# Patient Record
Sex: Male | Born: 1955 | Race: White | Hispanic: No | Marital: Married | Smoking: Never smoker
Health system: Southern US, Community
[De-identification: ages and names within clinical notes are randomized; demographics above are authoritative.]

## PROBLEM LIST (undated history)

## (undated) DIAGNOSIS — I10 Essential (primary) hypertension: Secondary | ICD-10-CM

## (undated) DIAGNOSIS — E785 Hyperlipidemia, unspecified: Secondary | ICD-10-CM

## (undated) DIAGNOSIS — I2510 Atherosclerotic heart disease of native coronary artery without angina pectoris: Secondary | ICD-10-CM

## (undated) DIAGNOSIS — I219 Acute myocardial infarction, unspecified: Secondary | ICD-10-CM

## (undated) HISTORY — DX: Atherosclerotic heart disease of native coronary artery without angina pectoris: I25.10

## (undated) HISTORY — PX: ENDOSCOPIC STENT PLACEMENT W/ MLB: SHX1507

---
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
## 1997-03-22 DIAGNOSIS — I219 Acute myocardial infarction, unspecified: Secondary | ICD-10-CM

## 1997-03-22 HISTORY — DX: Acute myocardial infarction, unspecified: I21.9

## 1997-03-22 HISTORY — PX: CORONARY ANGIOPLASTY WITH STENT PLACEMENT: SHX49

## 1997-03-22 HISTORY — PX: CARDIAC CATHETERIZATION: SHX172

## 2012-01-19 ENCOUNTER — Ambulatory Visit (HOSPITAL_COMMUNITY): Payer: Self-pay

## 2012-01-19 ENCOUNTER — Ambulatory Visit (HOSPITAL_COMMUNITY)
Admission: RE | Admit: 2012-01-19 | Discharge: 2012-01-19 | Disposition: A | Discharge status: Home / Self Care | Payer: BC Managed Care – PPO | Source: Ambulatory Visit | Attending: Interventional Cardiology | Admitting: Interventional Cardiology

## 2012-01-19 DIAGNOSIS — I2510 Atherosclerotic heart disease of native coronary artery without angina pectoris: Secondary | ICD-10-CM | POA: Insufficient documentation

## 2012-01-19 DIAGNOSIS — I252 Old myocardial infarction: Secondary | ICD-10-CM

## 2012-01-19 NOTE — Progress Notes (Signed)
  Echocardiogram Echocardiogram Stress Test has been performed.  Timothy Hayden 01/19/2012, 12:42 PM

## 2012-10-24 ENCOUNTER — Other Ambulatory Visit (HOSPITAL_COMMUNITY): Payer: Self-pay | Admitting: Interventional Cardiology

## 2012-10-24 DIAGNOSIS — I2510 Atherosclerotic heart disease of native coronary artery without angina pectoris: Secondary | ICD-10-CM

## 2012-11-15 ENCOUNTER — Ambulatory Visit (HOSPITAL_COMMUNITY)
Admission: RE | Admit: 2012-11-15 | Discharge: 2012-11-15 | Disposition: A | Discharge status: Home / Self Care | Payer: BC Managed Care – PPO | Source: Ambulatory Visit | Attending: Interventional Cardiology | Admitting: Interventional Cardiology

## 2012-11-15 DIAGNOSIS — I252 Old myocardial infarction: Secondary | ICD-10-CM | POA: Insufficient documentation

## 2012-11-15 DIAGNOSIS — I2510 Atherosclerotic heart disease of native coronary artery without angina pectoris: Secondary | ICD-10-CM

## 2012-11-15 NOTE — Progress Notes (Signed)
  Echocardiogram Echocardiogram Stress Test has been performed.  Timothy Hayden FRANCES 11/15/2012, 10:46 AM

## 2012-12-20 ENCOUNTER — Telehealth: Payer: Self-pay | Admitting: Interventional Cardiology

## 2012-12-20 ENCOUNTER — Telehealth: Payer: Self-pay | Admitting: Cardiology

## 2012-12-20 NOTE — Telephone Encounter (Signed)
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

## 2012-12-20 NOTE — Telephone Encounter (Signed)
Lmom letting pt know I will leave tracings up front for him to pick up.

## 2013-04-04 ENCOUNTER — Telehealth: Payer: Self-pay | Admitting: *Deleted

## 2013-04-04 MED ORDER — METOPROLOL TARTRATE 50 MG PO TABS: 50.0000 mg | ORAL_TABLET | Freq: Two times a day (BID) | ORAL | Status: DC

## 2013-04-04 MED ORDER — ATORVASTATIN CALCIUM 40 MG PO TABS: 40.0000 mg | ORAL_TABLET | Freq: Every day | ORAL | Status: DC

## 2013-04-04 NOTE — Telephone Encounter (Signed)
Spoke with pts wife and verified statin, which is atorvastatin. Refilled.

## 2013-04-04 NOTE — Telephone Encounter (Signed)
Patient requests refills on simvastatin and metoprolol to be [facility] to [facility] [address] in [place]. Thanks, MI

## 2013-06-23 ENCOUNTER — Encounter: Payer: Self-pay | Admitting: *Deleted

## 2013-06-23 DIAGNOSIS — I2510 Atherosclerotic heart disease of native coronary artery without angina pectoris: Secondary | ICD-10-CM | POA: Insufficient documentation

## 2013-06-23 DIAGNOSIS — I25810 Atherosclerosis of coronary artery bypass graft(s) without angina pectoris: Secondary | ICD-10-CM

## 2013-09-17 ENCOUNTER — Encounter: Payer: Self-pay | Admitting: Interventional Cardiology

## 2013-09-27 ENCOUNTER — Other Ambulatory Visit: Payer: Self-pay | Admitting: Interventional Cardiology

## 2013-09-28 ENCOUNTER — Telehealth: Payer: Self-pay | Admitting: *Deleted

## 2013-09-28 MED ORDER — ATORVASTATIN CALCIUM 40 MG PO TABS: 40.0000 mg | ORAL_TABLET | Freq: Every day | ORAL | Status: DC

## 2013-09-28 NOTE — Telephone Encounter (Signed)
Refilled

## 2013-09-28 NOTE — Telephone Encounter (Signed)
[facility] [place] requests 90 day atorvastatin refill for patient. Thanks, MI

## 2013-10-01 ENCOUNTER — Ambulatory Visit (INDEPENDENT_AMBULATORY_CARE_PROVIDER_SITE_OTHER): Payer: BC Managed Care – PPO | Admitting: Interventional Cardiology

## 2013-10-01 ENCOUNTER — Telehealth: Payer: Self-pay | Admitting: Interventional Cardiology

## 2013-10-01 ENCOUNTER — Encounter: Payer: Self-pay | Admitting: Interventional Cardiology

## 2013-10-01 VITALS — BP 116/70 | HR 55 | Ht 69.0 in | Wt 202.2 lb

## 2013-10-01 DIAGNOSIS — E782 Mixed hyperlipidemia: Secondary | ICD-10-CM | POA: Insufficient documentation

## 2013-10-01 DIAGNOSIS — I2510 Atherosclerotic heart disease of native coronary artery without angina pectoris: Secondary | ICD-10-CM | POA: Insufficient documentation

## 2013-10-01 DIAGNOSIS — I252 Old myocardial infarction: Secondary | ICD-10-CM

## 2013-10-01 NOTE — Telephone Encounter (Signed)
Pt spoke with his insurance company and it will cost him $1300 out of pocket to have stress echocardiogram here at our office. Pt would like to know if Dr. Eldridge DaceVaranasi is okay with him having somewhere else; possibly in [place].

## 2013-10-01 NOTE — Patient Instructions (Signed)
Your physician recommends that you continue on your current medications as directed. Please refer to the Current Medication list given to you today.  Your physician has requested that you have a stress echocardiogram. For further information please visit [email or web address]. Please follow instruction sheet as given.  Your physician wants you to follow-up in:1 year with Dr. Varanasi. You will receive a reminder letter in the mail two months in advance. If you don't receive a letter, please call our office to schedule the follow-up appointment.  

## 2013-10-01 NOTE — Progress Notes (Signed)
Patient ID: Timothy Hayden, male   DOB: [birth year], 58 y.o.   MRN: [id number]    [address] [place], [place]  [address] Phone: [phone] Fax:  [phone]  Date:  10/01/2013   ID:  Timothy Hayden, DOB [birth year], MRN [id number]  PCP:  Malka SoJOBE,DANIEL B., MD      History of Present Illness: Timothy Hayden is a 58 y.o. 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 15 years ago, he was in [place] and had a cath and subsequent angioplasty at that time. A stent was placed. Six months later, he had a repeat cath and everything was fine at that time. He has had annual stress tests since then and passed all of them.  In 1999, he had pain in the middle of his chest. He has not had any further sx like that. He could also feel some palpitations as well.  He needs a stress echo for his pilot's license Exercise had decreased over the past year. He has increased recently. He walks in the treadmill and uses a bike without symptoms. No issues with side effects from the medicine he is taking. No lightheadedness, muscle pains, syncope. CAD/ASCVD:  Denies : Chest pain.  Diaphoresis.  Dizziness.  Dyspnea on exertion.  Leg edema.  Nitroglycerin.  Orthopnea.  Palpitations.  Paroxysmal nocturnal dyspnea.  Shortness of breath.  Syncope.   Last stress was in 8/14 and was negative for ischemia.  Wt Readings from Last 3 Encounters:  10/01/13 202 lb 3.2 oz (91.717 kg)     Past Medical History  Diagnosis Date  . CAD (coronary artery disease)     Current Outpatient Prescriptions  Medication Sig Dispense Refill  . Ascorbic Acid (VITAMIN C) 1000 MG tablet Take 1,000 mg by mouth daily.      Marland Kitchen. aspirin 81 MG tablet Take 81 mg by mouth daily.      Marland Kitchen. atorvastatin (LIPITOR) 40 MG tablet Take 1 tablet (40 mg total) by mouth daily.  90 tablet  0  . metoprolol (LOPRESSOR) 50 MG tablet TAKE 1 TABLET TWICE A DAY  180 tablet  0  . Omega-3 Fatty Acids (FISH OIL) 1000 MG CAPS Take by mouth.       No  current facility-administered medications for this visit.    Allergies:   No Known Allergies  Social History:  The patient  reports that he has never smoked. He does not have any smokeless tobacco history on file. He reports that he does not drink alcohol or use illicit drugs.   Family History:  The patient's family history includes CAD in his mother.   ROS:  Please see the history of present illness.  No nausea, vomiting.  No fevers, chills.  No focal weakness.  No dysuria.    All other systems reviewed and negative.   PHYSICAL EXAM: VS:  BP 116/70  Pulse 55  Ht 5\' 9"  (1.753 m)  Wt 202 lb 3.2 oz (91.717 kg)  BMI 29.85 kg/m2 Well nourished, well developed, in no acute distress HEENT: normal Neck: no JVD, no carotid bruits Cardiac:  normal S1, S2; RRR;  Lungs:  clear to auscultation bilaterally, no wheezing, rhonchi or rales Abd: soft, nontender, no hepatomegaly Ext: no edema Skin: warm and dry Neuro:   no focal abnormalities noted  EKG:  Sinus bradycardia, otherwise normal    ASSESSMENT AND PLAN:  Coronary atherosclerosis of native coronary artery  Continue Aspirin Tablet, 81 MG, 1 tablet, Orally,  Once a day Notes: No angina. Plan for stress echo for pilot's license in 1/61. No bleeding side effects from the aspirin.    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 No side effects from this medicine.    3. Hypercholesterolemia, Mixed  Continue Atorvastatin Calcium Tablet, 40 MG, 1 tablet, Orally, Once a day Notes: LDL target 70. He will have labs sent over to our office. In 12/23/11, LDL was 83, HDL 39. Fish oil was started. No myalgias. To be cecked by his new PMD and will have results sent to [place].   Follow Up  1 Year (Reason: CAD)     Signed, Fredric Mare, MD, [facility] 10/01/2013 10:19 AM

## 2013-10-01 NOTE — Telephone Encounter (Signed)
New message ° ° ° ° ° ° ° ° ° °Pt returning nurses call °

## 2013-10-02 NOTE — Telephone Encounter (Signed)
Awaiting return call from patient to let me know where he would like to have test done.

## 2013-10-02 NOTE — Telephone Encounter (Signed)
Ok with me 

## 2013-10-12 NOTE — Telephone Encounter (Signed)
lmtrc

## 2013-10-15 NOTE — Telephone Encounter (Signed)
Spoke with pt and he would like to keep appt here in our office on 10/22/13.

## 2013-10-16 ENCOUNTER — Ambulatory Visit: Payer: BC Managed Care – PPO | Admitting: Interventional Cardiology

## 2013-10-22 ENCOUNTER — Ambulatory Visit (HOSPITAL_COMMUNITY): Payer: BC Managed Care – PPO | Attending: Interventional Cardiology

## 2013-10-22 DIAGNOSIS — I2510 Atherosclerotic heart disease of native coronary artery without angina pectoris: Secondary | ICD-10-CM

## 2013-10-22 NOTE — Progress Notes (Addendum)
Stress echo performed. 

## 2013-12-12 ENCOUNTER — Telehealth: Payer: Self-pay | Admitting: Cardiology

## 2013-12-12 NOTE — Telephone Encounter (Signed)
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  Fax [phone]  Phone # [phone]

## 2013-12-13 ENCOUNTER — Encounter: Payer: Self-pay | Admitting: Cardiology

## 2013-12-13 NOTE — Telephone Encounter (Signed)
Letter created, signed and faxed to FAA.

## 2013-12-19 ENCOUNTER — Other Ambulatory Visit: Payer: Self-pay

## 2013-12-19 MED ORDER — ATORVASTATIN CALCIUM 40 MG PO TABS: 40.0000 mg | ORAL_TABLET | Freq: Every day | ORAL | Status: DC

## 2013-12-19 MED ORDER — METOPROLOL TARTRATE 50 MG PO TABS: ORAL_TABLET | ORAL | Status: DC

## 2014-06-10 ENCOUNTER — Other Ambulatory Visit: Payer: Self-pay | Admitting: Interventional Cardiology

## 2014-12-02 ENCOUNTER — Encounter: Payer: Self-pay | Admitting: Interventional Cardiology

## 2014-12-02 ENCOUNTER — Ambulatory Visit (INDEPENDENT_AMBULATORY_CARE_PROVIDER_SITE_OTHER): Payer: BLUE CROSS/BLUE SHIELD | Admitting: Interventional Cardiology

## 2014-12-02 VITALS — BP 106/74 | HR 79 | Ht 69.0 in | Wt 196.1 lb

## 2014-12-02 DIAGNOSIS — I252 Old myocardial infarction: Secondary | ICD-10-CM | POA: Diagnosis not present

## 2014-12-02 DIAGNOSIS — E782 Mixed hyperlipidemia: Secondary | ICD-10-CM

## 2014-12-02 DIAGNOSIS — I2510 Atherosclerotic heart disease of native coronary artery without angina pectoris: Secondary | ICD-10-CM | POA: Diagnosis not present

## 2014-12-02 NOTE — Patient Instructions (Signed)
Medication Instructions:  Your physician recommends that you continue on your current medications as directed. Please refer to the Current Medication list given to you today.   Labwork: none  Testing/Procedures: Your physician has requested that you have a stress echocardiogram. For further information please visit [email or web address]. Please follow instruction sheet as given.    Follow-Up: Your physician wants you to follow-up in: 12 months.  You will receive a reminder letter in the mail two months in advance. If you don't receive a letter, please call our office to schedule the follow-up appointment.   Any Other Special Instructions Will Be Listed Below (If Applicable).   

## 2014-12-02 NOTE — Progress Notes (Signed)
Patient ID: Timothy Hayden, male   DOB: 06-01-1955, 59 y.o.   MRN: [id number]     Cardiology Office Note   Date:  12/02/2014   ID:  Timothy Hayden, DOB Jul 04, 1955, MRN [id number]  PCP:  Malka So., MD    No chief complaint on file. 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 In 1999, he was in [place], [place] and had a cath and subsequent angioplasty at that time. A stent was placed. Six months later, he had a repeat cath and everything was fine at that time. He has had annual stress tests since then and passed all of them.  In 1999, he had pain in the middle of his chest. He has not had any further sx like that. He could also feel some palpitations as well.   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 Exercise had decreased over the past year. He walks in the treadmill and uses a bike without symptoms. No issues with side effects from the medicine he is taking. No lightheadedness, muscle pains, syncope. CAD/ASCVD:  Denies : Chest pain.  Diaphoresis.  Dizziness.  Dyspnea on exertion.  Leg edema.  Nitroglycerin.  Orthopnea.  Palpitations.  Paroxysmal nocturnal dyspnea.  Shortness of breath.  Syncope.   Last stress was in 8/15 and was negative for ischemia.  Overall, he is feeling well.  He needs another stress test.    Past Medical History  Diagnosis Date  . CAD (coronary artery disease)     Past Surgical History  Procedure Laterality Date  . Endoscopic stent placement w/ mlb       Current Outpatient Prescriptions  Medication Sig Dispense Refill  . aspirin 81 MG tablet Take 81 mg by mouth daily.    Marland Kitchen atorvastatin (LIPITOR) 40 MG tablet TAKE 1 TABLET (40 MG TOTAL) BY MOUTH DAILY. 90 tablet 1  . metoprolol (LOPRESSOR) 50 MG tablet Take 50 mg by mouth 2 (two) times daily.    .  Multiple Vitamin (MULTI-VITAMINS) TABS Take by mouth daily.    . Omega-3 Fatty Acids (FISH OIL) 1000 MG CAPS Take 1,000 mg by mouth daily.      No current facility-administered medications for this visit.    Allergies:   Review of patient's allergies indicates no known allergies.    Social History:  The patient  reports that he has never smoked. He does not have any smokeless tobacco history on file. He reports that he does not drink alcohol or use illicit drugs.   Family History:  The patient's family history includes CAD in his mother.    ROS:  Please see the history of present illness.   Otherwise, review of systems are positive for knee pain.   All other systems are reviewed and negative.    PHYSICAL EXAM: VS:  BP 106/74 mmHg  Pulse 79  Ht 5\' 9"  (1.753 m)  Wt 196 lb 1.9 oz (88.959 kg)  BMI 28.95 kg/m2 , BMI Body mass index is 28.95 kg/(m^2). GEN: Well nourished, well developed, in no acute distress HEENT: normal Neck: no JVD, carotid bruits, or masses Cardiac: RRR; no murmurs, rubs, or gallops,no edema  Respiratory:  clear to auscultation bilaterally, normal work of breathing GI: soft, nontender, nondistended, +  BS, small umbilical hernia MS: no deformity or atrophy Skin: warm and dry, no rash Neuro:  Strength and sensation are intact Psych: euthymic mood, full affect   EKG:   The ekg ordered today demonstrates Normal ECG   Recent Labs: No results found for requested labs within last 365 days.   Lipid Panel No results found for: CHOL, TRIG, HDL, CHOLHDL, VLDL, LDLCALC, LDLDIRECT   Other studies Reviewed: Additional studies/ records that were reviewed today with results demonstrating: Stress in 8/15 without ischemia.   ASSESSMENT AND PLAN:  1. CAD: No angina. Plan for stress echo for pilot's license in 9/52. No bleeding side effects from the aspirin.  Continue aspirin.   2. Old MI:  COntinue metoprolol.   No signs of congestive heart failure. 3. Hyperlipidemia:   Followed by Dr. Derrell Lolling, his PMD.  Continue statin. We'll try to obtain results from his PMDs office.   Current medicines are reviewed at length with the patient today.  The patient concerns regarding his medicines were addressed.  The following changes have been made:  No change  Labs/ tests ordered today include:   Orders Placed This Encounter  Procedures  . EKG 12-Lead  . Echo stress    Recommend 150 minutes/week of aerobic exercise Low fat, low carb, high fiber diet recommended  Disposition:   FU in 1 year   Delorise Jackson., MD  12/02/2014 9:54 AM    [facility] [address] [place], [place], [place]  [address] Phone: [phone]; Fax: [phone]

## 2014-12-07 ENCOUNTER — Other Ambulatory Visit: Payer: Self-pay | Admitting: Interventional Cardiology

## 2014-12-19 ENCOUNTER — Ambulatory Visit (HOSPITAL_COMMUNITY): Payer: BLUE CROSS/BLUE SHIELD | Attending: Interventional Cardiology

## 2014-12-19 DIAGNOSIS — I252 Old myocardial infarction: Secondary | ICD-10-CM | POA: Insufficient documentation

## 2014-12-19 DIAGNOSIS — I2510 Atherosclerotic heart disease of native coronary artery without angina pectoris: Secondary | ICD-10-CM

## 2014-12-23 ENCOUNTER — Encounter: Payer: Self-pay | Admitting: Interventional Cardiology

## 2014-12-24 ENCOUNTER — Telehealth: Payer: Self-pay | Admitting: Interventional Cardiology

## 2014-12-24 DIAGNOSIS — Z01810 Encounter for preprocedural cardiovascular examination: Secondary | ICD-10-CM

## 2014-12-24 DIAGNOSIS — E782 Mixed hyperlipidemia: Secondary | ICD-10-CM

## 2014-12-24 DIAGNOSIS — I2510 Atherosclerotic heart disease of native coronary artery without angina pectoris: Secondary | ICD-10-CM

## 2014-12-24 DIAGNOSIS — Z7901 Long term (current) use of anticoagulants: Secondary | ICD-10-CM

## 2014-12-24 NOTE — Telephone Encounter (Signed)
The pt is requesting a call from Dr Eldridge Dace to discuss his Echo results and the need for him to have a Cath. He states that he will be available to talk all day tomorrow at [phone]. Also, he states that he will be available at this number on and off after tomorrow.  He is advised that I am forwarding this message to Dr Eldridge Dace.

## 2014-12-24 NOTE — Telephone Encounter (Signed)
NewMessage  Pt calling to speak w/ RN concerning yesterdays conversation about echo stress. Please call back and discuss.

## 2014-12-25 NOTE — Telephone Encounter (Signed)
Patient is agreeable to cath.  He would like to try Wednesday October 19.  I am listed as off but I can do the case if you can get me scheduled for that one case only in the lab that day. He is expecting a call with instructions.  He can get blood work on that Monday before

## 2014-12-30 ENCOUNTER — Telehealth: Payer: Self-pay

## 2014-12-30 DIAGNOSIS — Z7901 Long term (current) use of anticoagulants: Secondary | ICD-10-CM

## 2014-12-30 DIAGNOSIS — R931 Abnormal findings on diagnostic imaging of heart and coronary circulation: Secondary | ICD-10-CM

## 2014-12-30 DIAGNOSIS — I2510 Atherosclerotic heart disease of native coronary artery without angina pectoris: Secondary | ICD-10-CM

## 2014-12-30 DIAGNOSIS — Z01812 Encounter for preprocedural laboratory examination: Secondary | ICD-10-CM

## 2014-12-30 NOTE — Telephone Encounter (Signed)
**Note De-Identified  Obfuscation** LMTCB. Per Dr Eldridge Dace the pt needs to have cath on 10/19. Cath has been scheduled on 10/19 at 8:30. Will need to go over Cath instructions with the pt (letter is under letters tab in the pts chart) and he will need to have BMET, CBCD and INR drawn on 10/17 (labs have been ordered and a appt scheduled to be drawn on 10/17).

## 2015-01-03 NOTE — Telephone Encounter (Signed)
**Note De-Identified  Obfuscation** The pt is aware and is coming to the office on 10/17 for lab work and he is aware that I will go over his cath instructions with him at That time.

## 2015-01-03 NOTE — Telephone Encounter (Signed)
**Note De-Identified  Obfuscation** The pt is coming to the office for his pre-procedure lab work on Monday 10/17 and he will see me to go over his cath instructions. The pt is aware.

## 2015-01-06 ENCOUNTER — Other Ambulatory Visit (INDEPENDENT_AMBULATORY_CARE_PROVIDER_SITE_OTHER): Payer: BLUE CROSS/BLUE SHIELD | Admitting: *Deleted

## 2015-01-06 DIAGNOSIS — Z7901 Long term (current) use of anticoagulants: Secondary | ICD-10-CM

## 2015-01-06 DIAGNOSIS — Z01810 Encounter for preprocedural cardiovascular examination: Secondary | ICD-10-CM

## 2015-01-06 DIAGNOSIS — E782 Mixed hyperlipidemia: Secondary | ICD-10-CM

## 2015-01-06 DIAGNOSIS — I2510 Atherosclerotic heart disease of native coronary artery without angina pectoris: Secondary | ICD-10-CM | POA: Diagnosis not present

## 2015-01-06 LAB — COMPREHENSIVE METABOLIC PANEL
ALT: 27 U/L (ref 9–46)
AST: 23 U/L (ref 10–35)
Albumin: 4.2 g/dL (ref 3.6–5.1)
Alkaline Phosphatase: 78 U/L (ref 40–115)
BUN: 17 mg/dL (ref 7–25)
CO2: 28 mmol/L (ref 20–31)
Calcium: 9.4 mg/dL (ref 8.6–10.3)
Chloride: 103 mmol/L (ref 98–110)
Creat: 1.01 mg/dL (ref 0.70–1.25)
Glucose, Bld: 94 mg/dL (ref 65–99)
Potassium: 4.1 mmol/L (ref 3.5–5.3)
Sodium: 138 mmol/L (ref 135–146)
Total Bilirubin: 0.5 mg/dL (ref 0.2–1.2)
Total Protein: 7.4 g/dL (ref 6.1–8.1)

## 2015-01-06 LAB — LIPID PANEL
Cholesterol: 160 mg/dL (ref 125–200)
HDL: 38 mg/dL — ABNORMAL LOW (ref >=40)
LDL Cholesterol: 89 mg/dL (ref <130)
Total CHOL/HDL Ratio: 4.2 Ratio (ref <=5.0)
Triglycerides: 166 mg/dL — ABNORMAL HIGH (ref <150)
VLDL: 33 mg/dL — ABNORMAL HIGH (ref <30)

## 2015-01-06 LAB — PROTIME-INR

## 2015-01-06 NOTE — Telephone Encounter (Signed)
The pt arrives in the office for his pre cath lab work and he and I went over cath instructions.  I answered all of his questions.

## 2015-01-07 ENCOUNTER — Other Ambulatory Visit: Payer: Self-pay | Admitting: Interventional Cardiology

## 2015-01-07 DIAGNOSIS — R9439 Abnormal result of other cardiovascular function study: Secondary | ICD-10-CM

## 2015-01-07 LAB — CBC WITH DIFFERENTIAL/PLATELET
Basophils Absolute: 0.1 10*3/uL (ref 0.0–0.1)
Basophils Relative: 1 % (ref 0–1)
Eosinophils Absolute: 0.4 10*3/uL (ref 0.0–0.7)
Eosinophils Relative: 7 % — ABNORMAL HIGH (ref 0–5)
HCT: 42.1 % (ref 39.0–52.0)
Hemoglobin: 15 g/dL (ref 13.0–17.0)
Lymphocytes Relative: 26 % (ref 12–46)
Lymphs Abs: 1.4 10*3/uL (ref 0.7–4.0)
MCH: 30.5 pg (ref 26.0–34.0)
MCHC: 35.6 g/dL (ref 30.0–36.0)
MCV: 85.6 fL (ref 78.0–100.0)
MPV: 9.1 fL (ref 8.6–12.4)
Monocytes Absolute: 0.6 10*3/uL (ref 0.1–1.0)
Monocytes Relative: 11 % (ref 3–12)
Neutro Abs: 3 10*3/uL (ref 1.7–7.7)
Neutrophils Relative %: 55 % (ref 43–77)
Platelets: 194 10*3/uL (ref 150–400)
RBC: 4.92 MIL/uL (ref 4.22–5.81)
RDW: 13.8 % (ref 11.5–15.5)
WBC: 5.5 10*3/uL (ref 4.0–10.5)

## 2015-01-07 LAB — PROTIME-INR
INR: 1.06 (ref <1.50)
Prothrombin Time: 13.9 seconds (ref 11.6–15.2)

## 2015-01-07 NOTE — Addendum Note (Signed)
Addended by: Channing MuttersGRACE, Shaka Cardin J on: 01/07/2015 08:10 AM   Modules accepted: Orders

## 2015-01-08 ENCOUNTER — Ambulatory Visit (HOSPITAL_COMMUNITY)
Admission: RE | Admit: 2015-01-08 | Discharge: 2015-01-09 | Disposition: A | Discharge status: Home / Self Care | Payer: BLUE CROSS/BLUE SHIELD | Source: Ambulatory Visit | Attending: Interventional Cardiology | Admitting: Interventional Cardiology

## 2015-01-08 ENCOUNTER — Encounter (HOSPITAL_COMMUNITY): Payer: Self-pay | Admitting: Interventional Cardiology

## 2015-01-08 ENCOUNTER — Encounter (HOSPITAL_COMMUNITY)
Admission: RE | Disposition: A | Discharge status: Home / Self Care | Payer: Self-pay | Source: Ambulatory Visit | Attending: Interventional Cardiology

## 2015-01-08 DIAGNOSIS — I2510 Atherosclerotic heart disease of native coronary artery without angina pectoris: Secondary | ICD-10-CM | POA: Insufficient documentation

## 2015-01-08 DIAGNOSIS — E782 Mixed hyperlipidemia: Secondary | ICD-10-CM | POA: Insufficient documentation

## 2015-01-08 DIAGNOSIS — Z8249 Family history of ischemic heart disease and other diseases of the circulatory system: Secondary | ICD-10-CM | POA: Diagnosis not present

## 2015-01-08 DIAGNOSIS — Z79899 Other long term (current) drug therapy: Secondary | ICD-10-CM | POA: Diagnosis not present

## 2015-01-08 DIAGNOSIS — Y838 Other surgical procedures as the cause of abnormal reaction of the patient, or of later complication, without mention of misadventure at the time of the procedure: Secondary | ICD-10-CM | POA: Insufficient documentation

## 2015-01-08 DIAGNOSIS — T82855A Stenosis of coronary artery stent, initial encounter: Secondary | ICD-10-CM | POA: Diagnosis not present

## 2015-01-08 DIAGNOSIS — Z955 Presence of coronary angioplasty implant and graft: Secondary | ICD-10-CM

## 2015-01-08 DIAGNOSIS — Z7982 Long term (current) use of aspirin: Secondary | ICD-10-CM | POA: Diagnosis not present

## 2015-01-08 DIAGNOSIS — R9439 Abnormal result of other cardiovascular function study: Secondary | ICD-10-CM | POA: Diagnosis present

## 2015-01-08 HISTORY — DX: Acute myocardial infarction, unspecified: I21.9

## 2015-01-08 HISTORY — PX: CARDIAC CATHETERIZATION: SHX172

## 2015-01-08 HISTORY — DX: Hyperlipidemia, unspecified: E78.5

## 2015-01-08 LAB — POCT ACTIVATED CLOTTING TIME: Activated Clotting Time: 460 seconds

## 2015-01-08 SURGERY — LEFT HEART CATH AND CORONARY ANGIOGRAPHY

## 2015-01-08 MED ORDER — ACETAMINOPHEN 325 MG PO TABS: 650.0000 mg | ORAL_TABLET | ORAL | Status: DC | PRN

## 2015-01-08 MED ORDER — SODIUM CHLORIDE 0.9 % IJ SOLN: 3.0000 mL | Freq: Two times a day (BID) | INTRAMUSCULAR | Status: DC

## 2015-01-08 MED ORDER — ONDANSETRON HCL 4 MG/2ML IJ SOLN: 4.0000 mg | Freq: Four times a day (QID) | INTRAMUSCULAR | Status: DC | PRN

## 2015-01-08 MED ORDER — TIROFIBAN HCL IV 12.5 MG/250 ML
INTRAVENOUS | Status: DC | PRN
  Administered 2015-01-08: .15 ug/kg/min via INTRAVENOUS

## 2015-01-08 MED ORDER — HEPARIN SODIUM (PORCINE) 1000 UNIT/ML IJ SOLN
INTRAMUSCULAR | Status: DC | PRN
  Administered 2015-01-08: 6000 [IU] via INTRAVENOUS
  Administered 2015-01-08: 4500 [IU] via INTRAVENOUS

## 2015-01-08 MED ORDER — CLOPIDOGREL BISULFATE 300 MG PO TABS
ORAL_TABLET | ORAL | Status: DC | PRN
  Administered 2015-01-08: 600 mg via ORAL

## 2015-01-08 MED ORDER — SODIUM CHLORIDE 0.9 % IV SOLN: 250.0000 mL | INTRAVENOUS | Status: DC | PRN

## 2015-01-08 MED ORDER — ASPIRIN 81 MG PO TABS: 81.0000 mg | ORAL_TABLET | Freq: Every day | ORAL | Status: DC

## 2015-01-08 MED ORDER — IOHEXOL 350 MG/ML SOLN
INTRAVENOUS | Status: DC | PRN
  Administered 2015-01-08: 160 mL via INTRA_ARTERIAL

## 2015-01-08 MED ORDER — ASPIRIN 81 MG PO CHEW
81.0000 mg | CHEWABLE_TABLET | ORAL | Status: AC
  Administered 2015-01-08: 81 mg via ORAL

## 2015-01-08 MED ORDER — MIDAZOLAM HCL 2 MG/2ML IJ SOLN
INTRAMUSCULAR | Status: DC | PRN
  Administered 2015-01-08: 1 mg via INTRAVENOUS
  Administered 2015-01-08: 2 mg via INTRAVENOUS

## 2015-01-08 MED ORDER — VERAPAMIL HCL 2.5 MG/ML IV SOLN
INTRAVENOUS | Status: AC
  Filled 2015-01-08: qty 2

## 2015-01-08 MED ORDER — SODIUM CHLORIDE 0.9 % IJ SOLN: 3.0000 mL | INTRAMUSCULAR | Status: DC | PRN

## 2015-01-08 MED ORDER — SODIUM CHLORIDE 0.9 % WEIGHT BASED INFUSION: 1.0000 mL/kg/h | INTRAVENOUS | Status: DC

## 2015-01-08 MED ORDER — SODIUM CHLORIDE 0.9 % IV SOLN
250.0000 mL | INTRAVENOUS | Status: DC | PRN
  Administered 2015-01-08: 200 mL via INTRAVENOUS

## 2015-01-08 MED ORDER — NITROGLYCERIN 1 MG/10 ML FOR IR/CATH LAB
INTRA_ARTERIAL | Status: DC | PRN
  Administered 2015-01-08: 200 ug via INTRACORONARY
  Administered 2015-01-08: 200 ug via INTRA_ARTERIAL
  Administered 2015-01-08: 200 ug via INTRACORONARY

## 2015-01-08 MED ORDER — MIDAZOLAM HCL 2 MG/2ML IJ SOLN
INTRAMUSCULAR | Status: AC
  Filled 2015-01-08: qty 4

## 2015-01-08 MED ORDER — METOPROLOL TARTRATE 50 MG PO TABS
50.0000 mg | ORAL_TABLET | Freq: Two times a day (BID) | ORAL | Status: DC
  Administered 2015-01-08 – 2015-01-09 (×2): 50 mg via ORAL
  Filled 2015-01-08 (×2): qty 1
  Filled 2015-01-08: qty 2
  Filled 2015-01-08: qty 1
  Filled 2015-01-08: qty 2
  Filled 2015-01-08: qty 1

## 2015-01-08 MED ORDER — ANGIOPLASTY BOOK
Freq: Once | Status: AC
  Administered 2015-01-08: 22:00:00
  Filled 2015-01-08: qty 1

## 2015-01-08 MED ORDER — NITROGLYCERIN 1 MG/10 ML FOR IR/CATH LAB
INTRA_ARTERIAL | Status: AC
  Filled 2015-01-08: qty 10

## 2015-01-08 MED ORDER — TIROFIBAN (AGGRASTAT) BOLUS VIA INFUSION
INTRAVENOUS | Status: DC | PRN
  Administered 2015-01-08: 2200 ug via INTRAVENOUS

## 2015-01-08 MED ORDER — ATORVASTATIN CALCIUM 40 MG PO TABS
40.0000 mg | ORAL_TABLET | Freq: Every day | ORAL | Status: DC
  Administered 2015-01-08: 19:00:00 40 mg via ORAL
  Filled 2015-01-08: qty 1

## 2015-01-08 MED ORDER — HEPARIN (PORCINE) IN NACL 2-0.9 UNIT/ML-% IJ SOLN
INTRAMUSCULAR | Status: DC | PRN
  Administered 2015-01-08: 09:00:00 via INTRA_ARTERIAL

## 2015-01-08 MED ORDER — HEPARIN (PORCINE) IN NACL 2-0.9 UNIT/ML-% IJ SOLN
INTRAMUSCULAR | Status: DC | PRN
  Administered 2015-01-08: 10:00:00

## 2015-01-08 MED ORDER — LIDOCAINE HCL (PF) 1 % IJ SOLN
INTRAMUSCULAR | Status: AC
  Filled 2015-01-08: qty 30

## 2015-01-08 MED ORDER — ASPIRIN 81 MG PO CHEW
CHEWABLE_TABLET | ORAL | Status: AC
  Filled 2015-01-08: qty 1

## 2015-01-08 MED ORDER — TIROFIBAN HCL IV 5 MG/100ML
INTRAVENOUS | Status: AC
  Filled 2015-01-08: qty 100

## 2015-01-08 MED ORDER — SODIUM CHLORIDE 0.9 % WEIGHT BASED INFUSION
3.0000 mL/kg/h | INTRAVENOUS | Status: AC
  Administered 2015-01-08: 13:00:00 3 mL/kg/h via INTRAVENOUS

## 2015-01-08 MED ORDER — SODIUM CHLORIDE 0.9 % WEIGHT BASED INFUSION
3.0000 mL/kg/h | INTRAVENOUS | Status: DC
  Administered 2015-01-08: 3 mL/kg/h via INTRAVENOUS

## 2015-01-08 MED ORDER — ASPIRIN 81 MG PO CHEW
81.0000 mg | CHEWABLE_TABLET | Freq: Every day | ORAL | Status: DC
  Administered 2015-01-09: 10:00:00 81 mg via ORAL
  Filled 2015-01-08: qty 1

## 2015-01-08 MED ORDER — CLOPIDOGREL BISULFATE 75 MG PO TABS
75.0000 mg | ORAL_TABLET | Freq: Every day | ORAL | Status: DC
  Administered 2015-01-09: 75 mg via ORAL
  Filled 2015-01-08: qty 1

## 2015-01-08 MED ORDER — FENTANYL CITRATE (PF) 100 MCG/2ML IJ SOLN
INTRAMUSCULAR | Status: DC | PRN
  Administered 2015-01-08 (×2): 25 ug via INTRAVENOUS

## 2015-01-08 MED ORDER — CLOPIDOGREL BISULFATE 300 MG PO TABS
ORAL_TABLET | ORAL | Status: AC
  Filled 2015-01-08: qty 2

## 2015-01-08 MED ORDER — FENTANYL CITRATE (PF) 100 MCG/2ML IJ SOLN
INTRAMUSCULAR | Status: AC
  Filled 2015-01-08: qty 4

## 2015-01-08 MED ORDER — HEPARIN (PORCINE) IN NACL 2-0.9 UNIT/ML-% IJ SOLN
INTRAMUSCULAR | Status: AC
  Filled 2015-01-08: qty 1000

## 2015-01-08 SURGICAL SUPPLY — 20 items
BALLN EUPHORA RX 2.0X20 (BALLOONS) ×3
BALLN ~~LOC~~ EUPHORA RX 2.5X20 (BALLOONS) ×3
BALLOON EUPHORA RX 2.0X20 (BALLOONS) ×1 IMPLANT
BALLOON ~~LOC~~ EUPHORA RX 2.5X20 (BALLOONS) ×1 IMPLANT
CATH INFINITI 5 FR JL3.5 (CATHETERS) ×3 IMPLANT
CATH INFINITI 5FR ANG PIGTAIL (CATHETERS) ×3 IMPLANT
CATH INFINITI JR4 5F (CATHETERS) ×3 IMPLANT
DEVICE RAD COMP TR BAND LRG (VASCULAR PRODUCTS) ×3 IMPLANT
GLIDESHEATH SLEND SS 6F .021 (SHEATH) ×3 IMPLANT
GUIDE CATH RUNWAY 6FR CLS3 (CATHETERS) ×3 IMPLANT
KIT ENCORE 26 ADVANTAGE (KITS) ×3 IMPLANT
KIT HEART LEFT (KITS) ×3 IMPLANT
PACK CARDIAC CATHETERIZATION (CUSTOM PROCEDURE TRAY) ×3 IMPLANT
STENT SYNERGY DES 2.25X32 (Permanent Stent) ×3 IMPLANT
SYR MEDRAD MARK V 150ML (SYRINGE) ×3 IMPLANT
TRANSDUCER W/STOPCOCK (MISCELLANEOUS) ×3 IMPLANT
TUBING CIL FLEX 10 FLL-RA (TUBING) ×3 IMPLANT
VALVE GUARDIAN II ~~LOC~~ HEMO (MISCELLANEOUS) ×3 IMPLANT
WIRE ASAHI PROWATER 180CM (WIRE) ×3 IMPLANT
WIRE SAFE-T 1.5MM-J .035X260CM (WIRE) ×3 IMPLANT

## 2015-01-08 NOTE — Interval H&P Note (Signed)
Cath Lab Visit (complete for each Cath Lab visit)  Clinical Evaluation Leading to the Procedure:   ACS: No.  Non-ACS:    Anginal Classification: CCS II  Anti-ischemic medical therapy: Minimal Therapy (1 class of medications)  Non-Invasive Test Results: High-risk stress test findings: cardiac mortality >3%/year  Prior CABG: No previous CABG      History and Physical Interval Note:  01/08/2015 8:48 AM  Timothy Hayden  has presented today for surgery, with the diagnosis of abnormal ekg/echo  The various methods of treatment have been discussed with the patient and family. After consideration of risks, benefits and other options for treatment, the patient has consented to  Procedure(Hayden): Left Heart Cath and Coronary Angiography (N/A) as a surgical intervention .  The patient'Hayden history has been reviewed, patient examined, no change in status, stable for surgery.  I have reviewed the patient'Hayden chart and labs.  Questions were answered to the patient'Hayden satisfaction.     Timothy Hayden.

## 2015-01-08 NOTE — Progress Notes (Signed)
TR BAND REMOVAL  LOCATION:    right radial  DEFLATED PER PROTOCOL:    Yes.    TIME BAND OFF / DRESSING APPLIED:    1345   SITE UPON ARRIVAL:    Level 0  SITE AFTER BAND REMOVAL:    Level 0  REVERSE ALLEN'S TEST:     positive  CIRCULATION SENSATION AND MOVEMENT:    Within Normal Limits   Yes.    COMMENTS:   Tolerated procedure well

## 2015-01-08 NOTE — H&P (Signed)
Timothy Hayden is an 59 y.o. male.   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 In 1999, he was in [place], [place] and had a cath and subsequent angioplasty at that time. A stent was placed. Six months later, he had a repeat cath and everything was fine at that time. He has had annual stress tests since then and passed all of them.  In 1999, he had pain in the middle of his chest. He has not had any further sx like that. He could also feel some palpitations as well.   He had a resting inferior WMA and then there was ischemia with stress.  THere were 3 mm ST segment depressions which were new compared to last year.  Past Medical History  Diagnosis Date  . CAD (coronary artery disease)     Past Surgical History  Procedure Laterality Date  . Endoscopic stent placement w/ mlb      Family History  Problem Relation Age of Onset  . CAD Mother    Social History:  reports that he has never smoked. He does not have any smokeless tobacco history on file. He reports that he does not drink alcohol or use illicit drugs.  Allergies: No Known Allergies  Medications Prior to Admission  Medication Sig Dispense Refill  . aspirin 81 MG tablet Take 81 mg by mouth daily.    Marland Kitchen atorvastatin (LIPITOR) 40 MG tablet TAKE 1 TABLET (40 MG TOTAL) BY MOUTH DAILY. 90 tablet 3  . Ibuprofen-Famotidine (DUEXIS) 800-26.6 MG TABS Take 1 tablet by mouth daily as needed (pain).    . metoprolol (LOPRESSOR) 50 MG tablet TAKE 1 TABLET TWICE A DAY 180 tablet 3  . Multiple Vitamin (MULTI-VITAMINS) TABS Take 1 tablet by mouth daily.     . Omega-3 Fatty Acids (FISH OIL) 1000 MG CAPS Take 1,000 mg by mouth daily.       Results for orders placed or performed in visit on 01/06/15 (from the past 48 hour(s))  CBC with Differential     Status: Abnormal   Collection Time: 01/07/15  8:11 AM  Result Value Ref Range   WBC 5.5 4.0  - 10.5 K/uL   RBC 4.92 4.22 - 5.81 MIL/uL   Hemoglobin 15.0 13.0 - 17.0 g/dL   HCT 42.1 39.0 - 52.0 %   MCV 85.6 78.0 - 100.0 fL   MCH 30.5 26.0 - 34.0 pg   MCHC 35.6 30.0 - 36.0 g/dL   RDW 13.8 11.5 - 15.5 %   Platelets 194 150 - 400 K/uL   MPV 9.1 8.6 - 12.4 fL   Neutrophils Relative % 55 43 - 77 %   Neutro Abs 3.0 1.7 - 7.7 K/uL   Lymphocytes Relative 26 12 - 46 %   Lymphs Abs 1.4 0.7 - 4.0 K/uL   Monocytes Relative 11 3 - 12 %   Monocytes Absolute 0.6 0.1 - 1.0 K/uL   Eosinophils Relative 7 (H) 0 - 5 %   Eosinophils Absolute 0.4 0.0 - 0.7 K/uL   Basophils Relative 1 0 - 1 %   Basophils Absolute 0.1 0.0 - 0.1 K/uL   Smear Review Criteria for review not met   INR/PT     Status: None   Collection Time: 01/07/15  8:11 AM  Result Value Ref Range   Prothrombin Time 13.9 11.6 - 15.2 seconds   INR 1.06 <1.50  Comment: The INR is of principal utility in following patients on stable doses of oral anticoagulants.  The therapeutic range is generally 2.0 to 3.0, but may be 3.0 to 4.0 in patients with mechanical cardiac valves, recurrent embolisms and antiphospholipid antibodies (including lupus inhibitors).    No results found.  ROS: No bleeding issues, as above.  OBJECTIVE:   Vitals:   Filed Vitals:   01/08/15 0642  BP: 138/75  Pulse: 64  Temp: 98 F (36.7 C)  TempSrc: Oral  Resp: 18  Height: _0  (1.753 m)  Weight: 194 lb (87.998 kg)  SpO2: 100%   I&O's:  No intake or output data in the 24 hours ending 01/08/15 0813      PHYSICAL EXAM General: Well developed, well nourished, in no acute distress Head:   Normal cephalic and atramatic  Lungs:   Clear bilaterally to auscultation. Heart:   HRRR S1 S2  No JVD.   Abdomen: abdomen soft and non-tender Msk:  Back normal,  Normal strength and tone for age. Extremities:   No edema.   Neuro: Alert and oriented. Psych:  Normal affect, responds appropriately  LABS: Basic Metabolic Panel:  Recent Labs   01/06/15 0750  NA 138  K 4.1  CL 103  CO2 28  GLUCOSE 94  BUN 17  CREATININE 1.01  CALCIUM 9.4   Liver Function Tests:  Recent Labs  01/06/15 0750  AST 23  ALT 27  ALKPHOS 78  BILITOT 0.5  PROT 7.4  ALBUMIN 4.2   No results for input(s): LIPASE, AMYLASE in the last 72 hours. CBC:  Recent Labs  01/07/15 0811  WBC 5.5  NEUTROABS 3.0  HGB 15.0  HCT 42.1  MCV 85.6  PLT 194   Cardiac Enzymes: No results for input(s): CKTOTAL, CKMB, CKMBINDEX, TROPONINI in the last 72 hours. BNP: Invalid input(s): POCBNP D-Dimer: No results for input(s): DDIMER in the last 72 hours. Hemoglobin A1C: No results for input(s): HGBA1C in the last 72 hours. Fasting Lipid Panel:  Recent Labs  01/06/15 0750  CHOL 160  HDL 38*  LDLCALC 89  TRIG 166*  CHOLHDL 4.2   Thyroid Function Tests: No results for input(s): TSH, T4TOTAL, T3FREE, THYROIDAB in the last 72 hours.  Invalid input(s): FREET3 Anemia Panel: No results for input(s): VITAMINB12, FOLATE, FERRITIN, TIBC, IRON, RETICCTPCT in the last 72 hours. Coag Panel:   Lab Results  Component Value Date   INR 1.06 01/07/2015       Assessment/Plan 1) Abnormal stress test:  Plan for cath.  Several high risk features including change in LV function and marked ST depression.  Procedure explained to the patient at length and he is willing to proceed.  All questions answered.  VARANASI,JAYADEEP S. 01/08/2015, 8:13 AM

## 2015-01-09 ENCOUNTER — Encounter (HOSPITAL_COMMUNITY): Payer: Self-pay | Admitting: Physician Assistant

## 2015-01-09 DIAGNOSIS — T82855A Stenosis of coronary artery stent, initial encounter: Secondary | ICD-10-CM | POA: Diagnosis not present

## 2015-01-09 DIAGNOSIS — Z8249 Family history of ischemic heart disease and other diseases of the circulatory system: Secondary | ICD-10-CM | POA: Diagnosis not present

## 2015-01-09 DIAGNOSIS — E782 Mixed hyperlipidemia: Secondary | ICD-10-CM | POA: Diagnosis not present

## 2015-01-09 DIAGNOSIS — I2510 Atherosclerotic heart disease of native coronary artery without angina pectoris: Secondary | ICD-10-CM | POA: Diagnosis not present

## 2015-01-09 LAB — BASIC METABOLIC PANEL
Anion gap: 9 (ref 5–15)
BUN: 15 mg/dL (ref 6–20)
CO2: 25 mmol/L (ref 22–32)
Calcium: 8.8 mg/dL — ABNORMAL LOW (ref 8.9–10.3)
Chloride: 104 mmol/L (ref 101–111)
Creatinine, Ser: 0.96 mg/dL (ref 0.61–1.24)
GFR calc Af Amer: >60 mL/min (ref >60)
GFR calc non Af Amer: >60 mL/min (ref >60)
Glucose, Bld: 100 mg/dL — ABNORMAL HIGH (ref 65–99)
Potassium: 4 mmol/L (ref 3.5–5.1)
Sodium: 138 mmol/L (ref 135–145)

## 2015-01-09 LAB — CBC
HCT: 43.2 % (ref 39.0–52.0)
Hemoglobin: 14.4 g/dL (ref 13.0–17.0)
MCH: 29.3 pg (ref 26.0–34.0)
MCHC: 33.3 g/dL (ref 30.0–36.0)
MCV: 87.8 fL (ref 78.0–100.0)
Platelets: 186 10*3/uL (ref 150–400)
RBC: 4.92 MIL/uL (ref 4.22–5.81)
RDW: 12.9 % (ref 11.5–15.5)
WBC: 7.7 10*3/uL (ref 4.0–10.5)

## 2015-01-09 MED ORDER — CLOPIDOGREL BISULFATE 75 MG PO TABS: 75.0000 mg | ORAL_TABLET | Freq: Every day | ORAL | Status: DC

## 2015-01-09 MED ORDER — NITROGLYCERIN 0.4 MG SL SUBL: 0.4000 mg | SUBLINGUAL_TABLET | SUBLINGUAL | Status: DC | PRN

## 2015-01-09 NOTE — Discharge Instructions (Signed)
No driving for 24 hours. No lifting over 5 lbs for 1 week. No sexual activity for 1 week. Keep procedure site clean & dry. If you notice increased pain, swelling, bleeding or pus, call/return!  You may shower, but no soaking baths/hot tubs/pools for 1 week.  ° ° °

## 2015-01-09 NOTE — Discharge Summary (Signed)
Discharge Summary   Patient ID: Timothy Hayden,  MRN: [id number], DOB/AGE: 1955/04/04 59 y.o.  Admit date: 01/08/2015 Discharge date: 01/09/2015  Primary Care Provider: JOBE,DANIEL B. Primary Cardiologist: Dr. Eldridge Dace  Discharge Diagnoses Principal Problem:   Abnormal stress test Active Problems:   ASCVD (arteriosclerotic cardiovascular disease)   Mixed hyperlipidemia   Allergies No Known Allergies  Procedures  Cardiac catheterization 01/08/2015 Conclusion     Dist Cx lesion, 95% stenosed. Very Small vessel which does not supply much myocardium, to be treated medically.  Mid LAD lesion, 75% stenosed. Post intervention with a 2.25 x 32 Synergy DES postdilated to 2.6 mm, there is a 0% residual stenosis.  1st Diag lesion, 50% stenosed in stent restenosis. The lesion was previously treated with a stent (unknown type).  The left ventricular systolic function is normal with a small area of anterior /anterolateral hypokinesis.  Continue dual antiplatelet therapy for at least a year. The circumflex vessel is very small, it appears smaller than our smallest drug-eluting stent. I would favor medical therapy. He'll be watched overnight. Plan discharge tomorrow with continued aggressive secondary prevention.      Hospital Course  The patient is a 59 year old male who had HLD and CAD with previous PCI to unknown vessel in [place] in 1999. He underwent a treadmill stress echo on 12/19/2014 which came back abnormal, during stress portion, he developed 3 mm ST depression in the inferior and lateral leads consistent with ischemia, echocardiographic image at rest showed inferior wall hypokinesis, with stress, inferior wall hypokinesis again seen but does not appears to be worsened significantly. After discussing with the patient's primary cardiologist, he agreed to undergo cardiac catheterization.  He underwent the planned procedure on 01/08/2015 which showed 95% distal  LCx stenosis treated medically due to small size (smaller than smallest DES), 75% mid LAD lesion treated with 2.25x32 mm Synergy DES postdilated to 2.30mm. Postprocedure, he was placed on aspirin, Plavix along with home Lipitor and beta blocker. He was seen on the following morning on 10/20, which time he denies any significant chest discomfort or shortness breath. He is deemed stable for discharge from cardiology perspective. I have arranged close outpatient cardiology follow-up. I have instructed him not lift anything greater than 5 pounds for one week. I have discussed the importance of compliance with DAPT. He has been referred to cardiac rehabilitation nurse who will discuss regarding outpatient cardiac rehab as well.    Discharge Vitals Blood pressure 130/67, pulse 72, temperature 98 F (36.7 C), temperature source Oral, resp. rate 18, height  (1.753 m), weight 195 lb 1.7 oz (88.5 kg), SpO2 94 %.  Filed Weights   01/08/15 0642 01/09/15 0408  Weight: 194 lb (87.998 kg) 195 lb 1.7 oz (88.5 kg)    Labs  CBC  Recent Labs  01/07/15 0811 01/09/15 0455  WBC 5.5 7.7  NEUTROABS 3.0  --   HGB 15.0 14.4  HCT 42.1 43.2  MCV 85.6 87.8  PLT 194 186   Basic Metabolic Panel  Recent Labs  01/09/15 0455  NA 138  K 4.0  CL 104  CO2 25  GLUCOSE 100*  BUN 15  CREATININE 0.96  CALCIUM 8.8*    Disposition  Pt is being discharged home today in good condition.  Follow-up Plans & Appointments      Follow-up Information    Follow up with Tereso Newcomer, PA-C On 01/23/2015.   Specialties:  Physician Assistant, Radiology, Interventional Cardiology   Why:  11:50am.  Contact information:   [address] [place] [address] [phone]       Discharge Medications    Medication List    TAKE these medications        aspirin 81 MG tablet  Take 81 mg by mouth daily.     atorvastatin 40 MG tablet  Commonly known as:  LIPITOR  TAKE 1 TABLET (40 MG TOTAL)  BY MOUTH DAILY.     clopidogrel 75 MG tablet  Commonly known as:  PLAVIX  Take 1 tablet (75 mg total) by mouth daily with breakfast.     DUEXIS 800-26.6 MG Tabs  Generic drug:  Ibuprofen-Famotidine  Take 1 tablet by mouth daily as needed (pain).     Fish Oil 1000 MG Caps  Take 1,000 mg by mouth daily.     metoprolol 50 MG tablet  Commonly known as:  LOPRESSOR  TAKE 1 TABLET TWICE A DAY     MULTI-VITAMINS Tabs  Take 1 tablet by mouth daily.     nitroGLYCERIN 0.4 MG SL tablet  Commonly known as:  NITROSTAT  Place 1 tablet (0.4 mg total) under the tongue every 5 (five) minutes as needed.        Duration of Discharge Encounter   Greater than 30 minutes including physician time.  Ramond DialSigned, Meng, Hao PA-C Pager: [phone] 01/09/2015, 9:06 AM Patient seen and examined. I agree with the assessment and plan as detailed above. See also my additional thoughts below.   Refer also to the progress note from today. The patient is ready to be discharged home. I made the decision for discharge. I agree with the note as outlined above and the follow-up plans.  Willa RoughJeffrey Shaconda Hajduk, MD, [facility] 01/09/2015 10:53 AM

## 2015-01-09 NOTE — Progress Notes (Signed)
CARDIAC REHAB PHASE I   Walked independently without problems. Ed completed with good reception. Voiced understanding. Pt probably not interested in [facility] however will send referral to [facility]. 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 Understands importance of Plavix/ASA. [phone]  Harriet MassonReeve, Meagan Ancona Kristan CES, ACSM 01/09/2015 9:43 AM

## 2015-01-09 NOTE — Progress Notes (Signed)
   Patient Name: Timothy Hayden Date of Encounter: 01/09/2015  Primary Cardiologist: Dr. Eldridge DaceVaranasi   Principal Problem:   Abnormal stress test Active Problems:   ASCVD (arteriosclerotic cardiovascular disease)   Mixed hyperlipidemia    SUBJECTIVE  Denies any CP or SOB.   CURRENT MEDS . aspirin  81 mg Oral Daily  . atorvastatin  40 mg Oral q1800  . clopidogrel  75 mg Oral Q breakfast  . metoprolol  50 mg Oral BID  . sodium chloride  3 mL Intravenous Q12H    OBJECTIVE  Filed Vitals:   01/08/15 1543 01/08/15 2000 01/08/15 2113 01/09/15 0408  BP: 152/78  138/78 125/78  Pulse: 62  64 65  Temp: 97.5 F (36.4 C)  97.5 F (36.4 C) 98.4 F (36.9 C)  TempSrc: Oral  Oral Oral  Resp: 15 20 18 20   Height:      Weight:    195 lb 1.7 oz (88.5 kg)  SpO2: 100%  100% 94%    Intake/Output Summary (Last 24 hours) at 01/09/15 0817 Last data filed at 01/09/15 0411  Gross per 24 hour  Intake   1712 ml  Output   3050 ml  Net  -1338 ml   Filed Weights   01/08/15 0642 01/09/15 0408  Weight: 194 lb (87.998 kg) 195 lb 1.7 oz (88.5 kg)    PHYSICAL EXAM  General: Pleasant, NAD. Neuro: Alert and oriented X 3. Moves all extremities spontaneously. Psych: Normal affect. HEENT:  Normal  Neck: Supple without bruits or JVD. Lungs:  Resp regular and unlabored, CTA. Heart: RRR no s3, s4, or murmurs. R radial cath site clean dry with 2+ distal radial pulse Abdomen: Soft, non-tender, non-distended, BS + x 4.  Extremities: No clubbing, cyanosis or edema. DP/PT/Radials 2+ and equal bilaterally.  Accessory Clinical Findings  CBC  Recent Labs  01/07/15 0811 01/09/15 0455  WBC 5.5 7.7  NEUTROABS 3.0  --   HGB 15.0 14.4  HCT 42.1 43.2  MCV 85.6 87.8  PLT 194 186   Basic Metabolic Panel  Recent Labs  01/09/15 0455  NA 138  K 4.0  CL 104  CO2 25  GLUCOSE 100*  BUN 15  CREATININE 0.96  CALCIUM 8.8*    TELE NSR with HR 60-70s, no significant ventricular ectopy     ECG  NSR with TWI in V3, but not in adjacent leads  Radiology/Studies  No results found.  ASSESSMENT AND PLAN  1. Abnormal stress test  - Cath 01/08/2015 95% distal LCx stenosis treated medically due to small size (smaller than smallest DES), 75% mid LAD lesion treated with 2.25x32 mm Synergy DES postdilated to 2.226mm  - continue ASA, plavix, BB and lipitor. Discharge today after seen by cardiac rehab. Followup with Dr. Hoyle BarrVaranasi's office in 2-4 weeks  2. CAD s/p PCI in 1999  3. HLD  Signed, Azalee CourseMeng, Hao PA-C Pager: [phone] Patient seen and examined. I agree with the assessment and plan as detailed above. See also my additional thoughts below.   The patient is stable post PCI. He is now ready to be discharged home.  Timothy RoughJeffrey Helayna Dun, MD, [facility] 01/09/2015 8:24 AM

## 2015-01-15 ENCOUNTER — Telehealth: Payer: Self-pay | Admitting: Interventional Cardiology

## 2015-01-15 NOTE — Telephone Encounter (Signed)
New message ° ° ° ° ° °Pt request to talk to the nurse.  He would not tell me what he wanted °

## 2015-01-15 NOTE — Telephone Encounter (Addendum)
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 I advised the pt that we have received his paperwork and that Medical records has it. He is advised that I will call him when paperwork is ready for him to pick up. He verbalized understanding and thanked me for calling him back.

## 2015-01-22 NOTE — Progress Notes (Signed)
Cardiology Office Note   Date:  01/23/2015   ID:  Timothy Hayden, DOB [birth year], MRN [id number]  PCP:  Malka SoJOBE,DANIEL B., MD  Cardiologist:  Dr. Everette RankJay Varanasi   Electrophysiologist:  n/a  Chief Complaint  Patient presents with  . Hospitalization Follow-up    s/p PCI  . 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 Recently seen by Dr. Eldridge DaceVaranasi in follow-up 9/16. 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 This was abnormal and cardiac catheterization was arranged. LHC 01/08/15 demonstrated 95% distal LCx which was very small and treated medically, 75% mid LAD stenosis which was treated with a Synergy DES and 50% in-stent restenosis in the D1. Post PCI course was uneventful and he was discharged the next day.  Returns for FU.  He is doing well.  He is having some anxiety about his CAD.  The patient denies chest pain, shortness of breath, syncope, orthopnea, PND or significant pedal edema.    Studies/Reports Reviewed Today:  LHC 01/08/15 LAD: Mid 75%  >>  PCI: 2.25 x 32 mm Synergy DES D1: Stent with 50% ISR LCx: Mid 30%, distal 95% (small vessel) RCA: Proximal 30% LVEF normal Continue dual antiplatelet therapy for at least a year. The circumflex vessel is very small, it appears smaller than our smallest drug-eluting stent. I would favor medical therapy. He'll be watched overnight. Plan discharge tomorrow with continued aggressive secondary prevention.      ETT-Echo 12/19/14 Exercise 12:30; ECG with inferior and lateral ST depression 3 mm; inferior HK without significant worsening during stress  Myoview 8/13 Anterolateral scar, no ischemia, EF 64%    Past Medical History  Diagnosis Date  . CAD (coronary artery disease)     a. PCIx1 in 1999 b. abnormal nuc, then cath 01/08/2015 DES to 75% mid LAD, 95% small distal LCx  treated medically  . Hyperlipidemia   . Myocardial infarction [facility]) 1999    Past Surgical History  Procedure Laterality Date  . Endoscopic stent placement w/ mlb    . Cardiac catheterization N/A 01/08/2015    Procedure: Left Heart Cath and Coronary Angiography;  Surgeon: Corky CraftsJayadeep S Varanasi, MD;  [facility];  Service: Cardiovascular;  Laterality: N/A;  . Cardiac catheterization N/A 01/08/2015    Procedure: Coronary Stent Intervention;  Surgeon: Corky CraftsJayadeep S Varanasi, MD;  [facility];  Service: Cardiovascular;  Laterality: N/A;  mid lad 2.25x32 synergy  . Coronary angioplasty with stent placement  1999    "1 stent"  . Cardiac catheterization  1999  . Kidney surgery Right 1971    "had a blood vessel removed off my kidney"     Current Outpatient Prescriptions  Medication Sig Dispense Refill  . aspirin 81 MG tablet Take 81 mg by mouth daily.    Marland Kitchen. atorvastatin (LIPITOR) 80 MG tablet Take 1 tablet (80 mg total) by mouth at bedtime. 90 tablet 3  . clopidogrel (PLAVIX) 75 MG tablet Take 1 tablet (75 mg total) by mouth daily with breakfast. 90 tablet 3  . Ibuprofen-Famotidine (DUEXIS) 800-26.6 MG TABS Take 1 tablet by mouth daily as needed (pain).    . metoprolol (LOPRESSOR) 50 MG tablet TAKE 1 TABLET TWICE A DAY 180 tablet 3  . Multiple Vitamin (MULTI-VITAMINS) TABS Take 1 tablet by mouth daily.     .Marland Kitchen  nitroGLYCERIN (NITROSTAT) 0.4 MG SL tablet Place 1 tablet (0.4 mg total) under the tongue every 5 (five) minutes as needed. 25 tablet 3  . Omega-3 Fatty Acids (FISH OIL) 1000 MG CAPS Take 1,000 mg by mouth daily.      No current facility-administered medications for this visit.    Allergies:   Review of patient's allergies indicates no known allergies.    Social History:   Social History   Social History  . Marital Status: Married    Spouse Name: N/A  . Number of Children: N/A  . Years of Education: N/A   Social History Main Topics  . Smoking  status: Never Smoker   . Smokeless tobacco: Never Used  . Alcohol Use: No  . Drug Use: No  . Sexual Activity: Yes   Other Topics Concern  . None   Social History Narrative    Family History:   Family History  Problem Relation Age of Onset  . CAD Mother       ROS:   Please see the history of present illness.   Review of Systems  All other systems reviewed and are negative.     PHYSICAL EXAM: VS:  BP 118/70 mmHg  Pulse 56  Ht  (1.753 m)  Wt 201 lb 1.9 oz (91.227 kg)  BMI 29.69 kg/m2    Wt Readings from Last 3 Encounters:  01/23/15 201 lb 1.9 oz (91.227 kg)  01/09/15 195 lb 1.7 oz (88.5 kg)  12/02/14 196 lb 1.9 oz (88.959 kg)     GEN: Well nourished, well developed, in no acute distress HEENT: normal Neck: no JVD,  no masses Cardiac:  Normal S1/S2, RRR; no murmur ,  no rubs or gallops, no edema; right wrist without hematoma or mass  Respiratory:  clear to auscultation bilaterally, no wheezing, rhonchi or rales. GI: soft, nontender, nondistended, + BS MS: no deformity or atrophy Skin: warm and dry  Neuro:  CNs II-XII intact, Strength and sensation are intact Psych: Normal affect   EKG:  EKG is ordered today.  It demonstrates:   Sinus brady, HR 56, normal axis, QTc 411 ms, no changes.    Recent Labs: 01/06/2015: ALT 27 01/09/2015: BUN 15; Creatinine, Ser 0.96; Hemoglobin 14.4; Platelets 186; Potassium 4.0; Sodium 138    Lipid Panel    Component Value Date/Time   CHOL 160 01/06/2015 0750   TRIG 166* 01/06/2015 0750   HDL 38* 01/06/2015 0750   CHOLHDL 4.2 01/06/2015 0750   VLDL 33* 01/06/2015 0750   LDLCALC 89 01/06/2015 0750      ASSESSMENT AND PLAN:  1. CAD: History of stenting to the D1 in 1999 in [place], [place]. 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 He does have residual distal LCx disease (95%) in a very  small vessel that is treated medically. D1 stenosis in the stent 50%. He will need dual antiplatelet therapy for a minimum of 1 year.  Continue ASA, Plavix, statin, beta-blocker.  He prefers to do cardiac rehab on his own.  I did complete his paperwork for his airline company [employer]) to keep him out of work for 6 mos.  He may need to have a FU stress test or a relook heart cath prior to returning to flying.  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    2. Hyperlipidemia:  Continue statin.   LDL in  10/16 was 89.  With recent PCI, would like to see this < 70.  Will increase Lipitor to 80 mg QD and repeat Lipids and LFTs 6-8 weeks.       Medication Changes: Current medicines are reviewed at length with the patient today.  Concerns regarding medicines are as outlined above.  The following changes have been made:   Discontinued Medications   No medications on file   Modified Medications   Modified Medication Previous Medication   ATORVASTATIN (LIPITOR) 80 MG TABLET atorvastatin (LIPITOR) 40 MG tablet      Take 1 tablet (80 mg total) by mouth at bedtime.    TAKE 1 TABLET (40 MG TOTAL) BY MOUTH DAILY.   New Prescriptions   No medications on file   Labs/ tests ordered today include:   Orders Placed This Encounter  Procedures  . Lipid Profile  . Hepatic function panel  . EKG 12-Lead     Disposition:    FU with Dr. Everette Rank 2-3 mos.     Signed, Brynda Rim, MHS 01/23/2015 1:02 PM    [facility] [address] [place], [place], [place]  [address] Phone: [phone]; Fax: [phone]

## 2015-01-23 ENCOUNTER — Encounter: Payer: Self-pay | Admitting: Physician Assistant

## 2015-01-23 ENCOUNTER — Ambulatory Visit (INDEPENDENT_AMBULATORY_CARE_PROVIDER_SITE_OTHER): Payer: BLUE CROSS/BLUE SHIELD | Admitting: Physician Assistant

## 2015-01-23 VITALS — BP 118/70 | HR 56 | Ht 69.0 in | Wt 201.1 lb

## 2015-01-23 DIAGNOSIS — I2510 Atherosclerotic heart disease of native coronary artery without angina pectoris: Secondary | ICD-10-CM

## 2015-01-23 DIAGNOSIS — E785 Hyperlipidemia, unspecified: Secondary | ICD-10-CM

## 2015-01-23 MED ORDER — ATORVASTATIN CALCIUM 80 MG PO TABS: 80.0000 mg | ORAL_TABLET | Freq: Every day | ORAL | Status: DC

## 2015-01-23 NOTE — Patient Instructions (Addendum)
Medication Instructions:  1. INCREASE LIPITOR TO 80 MG DAILY; NEW RX  Labwork: FASTING LIPID AND LIVER PANEL TO BE DONE IN 6-8 WEEKS  Testing/Procedures: NONE  Follow-Up: 04/28/15 @ 8 AM WITH DR. VARANASI  Any Other Special Instructions Will Be Listed Below (If Applicable).     If you need a refill on your cardiac medications before your next appointment, please call your pharmacy.

## 2015-03-07 ENCOUNTER — Other Ambulatory Visit (INDEPENDENT_AMBULATORY_CARE_PROVIDER_SITE_OTHER): Payer: BLUE CROSS/BLUE SHIELD | Admitting: *Deleted

## 2015-03-07 DIAGNOSIS — E785 Hyperlipidemia, unspecified: Secondary | ICD-10-CM

## 2015-03-07 DIAGNOSIS — I2510 Atherosclerotic heart disease of native coronary artery without angina pectoris: Secondary | ICD-10-CM | POA: Diagnosis not present

## 2015-03-07 LAB — LIPID PANEL
Cholesterol: 140 mg/dL (ref 125–200)
HDL: 41 mg/dL (ref >=40)
LDL Cholesterol: 76 mg/dL (ref <130)
Total CHOL/HDL Ratio: 3.4 Ratio (ref <=5.0)
Triglycerides: 115 mg/dL (ref <150)
VLDL: 23 mg/dL (ref <30)

## 2015-03-07 LAB — HEPATIC FUNCTION PANEL
ALT: 22 U/L (ref 9–46)
AST: 20 U/L (ref 10–35)
Albumin: 4.2 g/dL (ref 3.6–5.1)
Alkaline Phosphatase: 80 U/L (ref 40–115)
Bilirubin, Direct: 0.2 mg/dL (ref <=0.2)
Indirect Bilirubin: 0.6 mg/dL (ref 0.2–1.2)
Total Bilirubin: 0.8 mg/dL (ref 0.2–1.2)
Total Protein: 7.3 g/dL (ref 6.1–8.1)

## 2015-03-07 NOTE — Addendum Note (Signed)
Addended by: Tonita PhoenixBOWDEN, ROBIN K on: 03/07/2015 07:47 AM   Modules accepted: Orders

## 2015-03-11 ENCOUNTER — Telehealth: Payer: Self-pay | Admitting: *Deleted

## 2015-03-11 DIAGNOSIS — I2510 Atherosclerotic heart disease of native coronary artery without angina pectoris: Secondary | ICD-10-CM

## 2015-03-11 DIAGNOSIS — E782 Mixed hyperlipidemia: Secondary | ICD-10-CM

## 2015-03-11 NOTE — Telephone Encounter (Signed)
Pt notified of lab results by phone with verbal understanding. Pt verified his appt 04/2015 with Dr. Eldridge DaceVaranasi.

## 2015-04-14 ENCOUNTER — Telehealth: Payer: Self-pay | Admitting: Interventional Cardiology

## 2015-04-14 NOTE — Telephone Encounter (Signed)
Pt requesting another  heart cath for  FAA regulations, pt also has questions re Plavix-has appt 04-28-15-if he would need to come off for any length of time would like to know now, I told him it's usually 5-7 days , pls call pt [phone]

## 2015-04-14 NOTE — Telephone Encounter (Signed)
I spoke with the pt and he has a pending appointment with Dr Eldridge Dace 04/28/15. The pt is under the impression that he will need a repeat cardiac catheterization for FAA regulations. The pt said that he will get documentation from his FAA medical doctor in regards to what he will need for clearance and bring this to his upcoming appointment. Due to earlier message with Melissa I advised the pt that he should not stop ASA and Plavix.

## 2015-04-28 ENCOUNTER — Encounter: Payer: Self-pay | Admitting: Interventional Cardiology

## 2015-04-28 ENCOUNTER — Telehealth: Payer: Self-pay | Admitting: Interventional Cardiology

## 2015-04-28 ENCOUNTER — Ambulatory Visit (INDEPENDENT_AMBULATORY_CARE_PROVIDER_SITE_OTHER): Payer: BLUE CROSS/BLUE SHIELD | Admitting: Interventional Cardiology

## 2015-04-28 VITALS — BP 124/80 | HR 63 | Ht 69.0 in | Wt 196.4 lb

## 2015-04-28 DIAGNOSIS — I2510 Atherosclerotic heart disease of native coronary artery without angina pectoris: Secondary | ICD-10-CM | POA: Diagnosis not present

## 2015-04-28 DIAGNOSIS — I252 Old myocardial infarction: Secondary | ICD-10-CM

## 2015-04-28 DIAGNOSIS — E782 Mixed hyperlipidemia: Secondary | ICD-10-CM

## 2015-04-28 DIAGNOSIS — R931 Abnormal findings on diagnostic imaging of heart and coronary circulation: Secondary | ICD-10-CM | POA: Diagnosis not present

## 2015-04-28 NOTE — Patient Instructions (Addendum)
Medication Instructions:  Your physician recommends that you continue on your current medications as directed. Please refer to the Current Medication list given to you today.   Labwork: None ordered  Testing/Procedures: Your physician has requested that you have en exercise stress myoview. For further information please visit [email or web address] Please follow instruction sheet, as given.    Follow-Up: Your physician wants you to follow-up in: 2 MONTHS WITH DR. VARANASI   Any Other Special Instructions Will Be Listed Below (If Applicable).    If you need a refill on your cardiac medications before your next appointment, please call your pharmacy.

## 2015-04-28 NOTE — Telephone Encounter (Signed)
I returned the pts call.  He wanted me to let Dr. Eldridge Dace know that Dr. Harle Stanford with the [facility] will be in his office this afternoon, for sure.  He advised that he only worked part-time, but would be there today if Dr. Eldridge Dace could call him today.  I advised the pt that I would send Dr. Eldridge Dace a message, that he was already gone from the office.  Pt verbalized understanding.

## 2015-04-28 NOTE — Telephone Encounter (Signed)
New message    patient was seen in the office today - very complicated will explain when the nurse.

## 2015-04-28 NOTE — Progress Notes (Signed)
Patient ID: Timothy Hayden, male   DOB: April 23, 1955, 59 y.o.   MRN: [id number]     Cardiology Office Note   Date:  04/28/2015   ID:  Timothy Hayden, DOB 1955-05-31, MRN [id number]  PCP:  Malka So., MD    No chief complaint on file. f/u CAD   Wt Readings from Last 3 Encounters:  04/28/15 196 lb 6.4 oz (89.086 kg)  01/23/15 201 lb 1.9 oz (91.227 kg)  01/09/15 195 lb 1.7 oz (88.5 kg)       History of Present Illness: Timothy Hayden is a 60 y.o. male  with a hx of CAD status post stenting in [place], [place] in 1999. Recently seen by Dr. Eldridge Dace in follow-up 9/16. 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 This was abnormal and cardiac catheterization was arranged. LHC 01/08/15 demonstrated 95% distal LCx which was very small and treated medically, 75% mid LAD stenosis which was treated with a Synergy DES.   He has done well since the PCI to the LAD. He denies any chest discomfort or shortness of breath. He is exercising regularly without any symptoms. Due to [facility] regulations, he will need repeat cardiac testing. Its unclear to me what exactly he'll need. He brought a paper showing that he'll need a nuclear stress test and a repeat cardiac cath. He states that before he returned to work after his MI in 1999, he needed a repeat cath.    Past Medical History  Diagnosis Date  . CAD (coronary artery disease)     a. PCIx1 in 1999 b. abnormal nuc, then cath 01/08/2015 DES to 75% mid LAD, 95% small distal LCx treated medically  . Hyperlipidemia   . Myocardial infarction [facility]) 1999    Past Surgical History  Procedure Laterality Date  . Endoscopic stent placement w/ mlb    . Cardiac catheterization N/A 01/08/2015    Procedure: Left Heart Cath and Coronary Angiography;  Surgeon: Corky Crafts, MD;  Location: [facility] INVASIVE CV LAB;  Service: Cardiovascular;  Laterality: N/A;  . Cardiac catheterization N/A 01/08/2015    Procedure: Coronary Stent  Intervention;  Surgeon: Corky Crafts, MD;  Location: [facility] INVASIVE CV LAB;  Service: Cardiovascular;  Laterality: N/A;  mid lad 2.25x32 synergy  . Coronary angioplasty with stent placement  1999    "1 stent"  . Cardiac catheterization  1999  . Kidney surgery Right 1971    "had a blood vessel removed off my kidney"     Current Outpatient Prescriptions  Medication Sig Dispense Refill  . aspirin 81 MG tablet Take 81 mg by mouth daily.    Marland Kitchen atorvastatin (LIPITOR) 80 MG tablet Take 1 tablet (80 mg total) by mouth at bedtime. 90 tablet 3  . clopidogrel (PLAVIX) 75 MG tablet Take 1 tablet (75 mg total) by mouth daily with breakfast. 90 tablet 3  . Ibuprofen-Famotidine (DUEXIS) 800-26.6 MG TABS Take 1 tablet by mouth daily as needed (pain).    . metoprolol (LOPRESSOR) 50 MG tablet TAKE 1 TABLET TWICE A DAY 180 tablet 3  . Multiple Vitamin (MULTI-VITAMINS) TABS Take 1 tablet by mouth daily.     . nitroGLYCERIN (NITROSTAT) 0.4 MG SL tablet Place 1 tablet (0.4 mg total) under the tongue every 5 (five) minutes as needed. 25 tablet 3  . Omega-3 Fatty Acids (FISH OIL) 1000 MG CAPS Take 1,000 mg by mouth daily.      No current facility-administered medications for this visit.  Allergies:   Review of patient's allergies indicates no known allergies.    Social History:  The patient  reports that he has never smoked. He has never used smokeless tobacco. He reports that he does not drink alcohol or use illicit drugs.   Family History:  The patient's family history includes CAD in his mother.    ROS:  Please see the history of present illness.   Otherwise, review of systems are positive for wanting to get back to work.   All other systems are reviewed and negative.    PHYSICAL EXAM: VS:  BP 124/80 mmHg  Pulse 63  Ht  (1.753 m)  Wt 196 lb 6.4 oz (89.086 kg)  BMI 28.99 kg/m2  SpO2 99% , BMI Body mass index is 28.99 kg/(m^2). GEN: Well nourished, well developed, in no acute  distress HEENT: normal Neck: no JVD, carotid bruits, or masses Cardiac: RRR; no murmurs, rubs, or gallops,no edema  Respiratory:  clear to auscultation bilaterally, normal work of breathing GI: soft, nontender, nondistended, + BS MS: no deformity or atrophy Skin: warm and dry, no rash Neuro:  Strength and sensation are intact Psych: euthymic mood, full affect     Recent Labs: 01/09/2015: BUN 15; Creatinine, Ser 0.96; Hemoglobin 14.4; Platelets 186; Potassium 4.0; Sodium 138 03/07/2015: ALT 22   Lipid Panel    Component Value Date/Time   CHOL 140 03/07/2015 0748   TRIG 115 03/07/2015 0748   HDL 41 03/07/2015 0748   CHOLHDL 3.4 03/07/2015 0748   VLDL 23 03/07/2015 0748   LDLCALC 76 03/07/2015 0748     Other studies Reviewed: Additional studies/ records that were reviewed today with results demonstrating: cath report reviewed as above.   ASSESSMENT AND PLAN:  1. CAD: Doing well form a cardiac standpoint.   Under normal circumstances, I would not plan for any further cardiac testing. 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 We may need to also repeat cardiac cath as well. He has a small circumflex with a lesion in the distal vessel. It's unclear to me whether intervention on this vessel is required for him to go back to work. We'll try to obtain that information from his [facility] physician.  He had a prior abnormal stress echo. 2.  hyperlipidemia: Continue current lipid-lowering therapy. Last LDL was 76. 3. I will talk try to talk to the [employer], Aeromedical Dept. [phone].  His FAA MD is Dr. Harle Stanford [phone].   4.  old MI: No signs of heart failure. Diagonal vessel was stented at that time.   Current medicines are reviewed at length with the patient today.  The patient concerns regarding his medicines were addressed.  The following changes have been made:  No change  Labs/ tests ordered today include:   Myoview No orders of the defined types were placed in this encounter.    Recommend 150 minutes/week of aerobic exercise Low fat, low carb, high fiber diet recommended  Disposition:   FU  Depending on results of stress test   Delorise Jackson., MD  04/28/2015 8:23 AM    [facility] [address] [place], [place], [place]  [address] Phone: [phone]; Fax: [phone]

## 2015-04-30 ENCOUNTER — Telehealth (HOSPITAL_COMMUNITY): Payer: Self-pay | Admitting: *Deleted

## 2015-04-30 NOTE — Telephone Encounter (Signed)
Left message on voicemail in reference to upcoming appointment scheduled for 05/05/15. Phone number given for a call back so details instructions can be given. Gwynne Kemnitz J Percilla Tweten, RN 

## 2015-05-01 ENCOUNTER — Telehealth (HOSPITAL_COMMUNITY): Payer: Self-pay | Admitting: *Deleted

## 2015-05-01 NOTE — Telephone Encounter (Signed)
Left message on voicemail in reference to upcoming appointment scheduled for 05/05/15. Phone number given for a call back so details instructions can be given. George Haggart J Emberlynn Riggan, RN 

## 2015-05-01 NOTE — Telephone Encounter (Signed)
I left a message for Dr. Harle Stanford [phone]

## 2015-05-05 ENCOUNTER — Ambulatory Visit (HOSPITAL_COMMUNITY): Payer: BLUE CROSS/BLUE SHIELD | Attending: Internal Medicine

## 2015-05-05 DIAGNOSIS — I2510 Atherosclerotic heart disease of native coronary artery without angina pectoris: Secondary | ICD-10-CM

## 2015-05-05 DIAGNOSIS — Z8249 Family history of ischemic heart disease and other diseases of the circulatory system: Secondary | ICD-10-CM | POA: Diagnosis not present

## 2015-05-05 DIAGNOSIS — R9439 Abnormal result of other cardiovascular function study: Secondary | ICD-10-CM | POA: Diagnosis not present

## 2015-05-05 DIAGNOSIS — R931 Abnormal findings on diagnostic imaging of heart and coronary circulation: Secondary | ICD-10-CM | POA: Diagnosis not present

## 2015-05-05 LAB — MYOCARDIAL PERFUSION IMAGING
Estimated workload: 11.3 METS
Exercise duration (min): 9 min
Exercise duration (sec): 45 s
LV dias vol: 108 mL
LV sys vol: 54 mL
MPHR: 160 {beats}/min
Peak HR: 157 {beats}/min
Percent HR: 98 %
RATE: 0.33
RPE: 18
Rest HR: 61 {beats}/min
SDS: 2
SRS: 7
SSS: 9
TID: 0.96

## 2015-05-05 MED ORDER — TECHNETIUM TC 99M SESTAMIBI GENERIC - CARDIOLITE
10.5000 | Freq: Once | INTRAVENOUS | Status: AC | PRN
  Administered 2015-05-05: 11 via INTRAVENOUS

## 2015-05-05 MED ORDER — TECHNETIUM TC 99M SESTAMIBI GENERIC - CARDIOLITE
31.9000 | Freq: Once | INTRAVENOUS | Status: AC | PRN
  Administered 2015-05-05: 31.9 via INTRAVENOUS

## 2015-05-19 ENCOUNTER — Telehealth: Payer: Self-pay | Admitting: Interventional Cardiology

## 2015-05-19 DIAGNOSIS — Z01812 Encounter for preprocedural laboratory examination: Secondary | ICD-10-CM

## 2015-05-19 NOTE — Telephone Encounter (Signed)
**Note De-Identified  Obfuscation** Is it ok to order cath and if so how soon do you want to do it? Please advise.

## 2015-05-19 NOTE — Telephone Encounter (Signed)
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

## 2015-05-20 ENCOUNTER — Encounter: Payer: Self-pay | Admitting: *Deleted

## 2015-05-20 NOTE — Telephone Encounter (Signed)
Scheduled cath for 05/26/15 at 0730. Went over instructions with pt and scheduled labs for 05/23/15. Informed pt that I would place instruction letter at front desk to be picked up when he comes in for labs. Pt verbalized understanding and was in agreement with this plan.

## 2015-05-20 NOTE — Telephone Encounter (Signed)
OK to schedule cath with me when convenient for him.

## 2015-05-22 ENCOUNTER — Other Ambulatory Visit: Payer: Self-pay | Admitting: Interventional Cardiology

## 2015-05-22 DIAGNOSIS — I2510 Atherosclerotic heart disease of native coronary artery without angina pectoris: Secondary | ICD-10-CM

## 2015-05-23 ENCOUNTER — Other Ambulatory Visit (INDEPENDENT_AMBULATORY_CARE_PROVIDER_SITE_OTHER): Payer: BLUE CROSS/BLUE SHIELD | Admitting: *Deleted

## 2015-05-23 DIAGNOSIS — Z01812 Encounter for preprocedural laboratory examination: Secondary | ICD-10-CM | POA: Diagnosis not present

## 2015-05-23 LAB — PROTIME-INR
INR: 1.06 (ref <1.50)
Prothrombin Time: 13.9 seconds (ref 11.6–15.2)

## 2015-05-23 LAB — CBC
HCT: 44.4 % (ref 39.0–52.0)
Hemoglobin: 15.1 g/dL (ref 13.0–17.0)
MCH: 29.6 pg (ref 26.0–34.0)
MCHC: 34 g/dL (ref 30.0–36.0)
MCV: 87.1 fL (ref 78.0–100.0)
MPV: 9 fL (ref 8.6–12.4)
Platelets: 190 10*3/uL (ref 150–400)
RBC: 5.1 MIL/uL (ref 4.22–5.81)
RDW: 13.4 % (ref 11.5–15.5)
WBC: 5.2 10*3/uL (ref 4.0–10.5)

## 2015-05-23 LAB — BASIC METABOLIC PANEL
BUN: 17 mg/dL (ref 7–25)
CO2: 25 mmol/L (ref 20–31)
Calcium: 9.1 mg/dL (ref 8.6–10.3)
Chloride: 103 mmol/L (ref 98–110)
Creat: 0.93 mg/dL (ref 0.70–1.25)
Glucose, Bld: 101 mg/dL — ABNORMAL HIGH (ref 65–99)
Potassium: 4.1 mmol/L (ref 3.5–5.3)
Sodium: 138 mmol/L (ref 135–146)

## 2015-05-26 ENCOUNTER — Encounter (HOSPITAL_COMMUNITY)
Admission: RE | Disposition: A | Discharge status: Home / Self Care | Payer: Self-pay | Source: Ambulatory Visit | Attending: Interventional Cardiology

## 2015-05-26 ENCOUNTER — Encounter (HOSPITAL_COMMUNITY): Payer: Self-pay | Admitting: Interventional Cardiology

## 2015-05-26 ENCOUNTER — Ambulatory Visit (HOSPITAL_COMMUNITY)
Admission: RE | Admit: 2015-05-26 | Discharge: 2015-05-26 | Disposition: A | Discharge status: Home / Self Care | Payer: BLUE CROSS/BLUE SHIELD | Source: Ambulatory Visit | Attending: Interventional Cardiology | Admitting: Interventional Cardiology

## 2015-05-26 DIAGNOSIS — Y712 Prosthetic and other implants, materials and accessory cardiovascular devices associated with adverse incidents: Secondary | ICD-10-CM | POA: Diagnosis not present

## 2015-05-26 DIAGNOSIS — Z7902 Long term (current) use of antithrombotics/antiplatelets: Secondary | ICD-10-CM | POA: Insufficient documentation

## 2015-05-26 DIAGNOSIS — Z8249 Family history of ischemic heart disease and other diseases of the circulatory system: Secondary | ICD-10-CM | POA: Diagnosis not present

## 2015-05-26 DIAGNOSIS — T82855A Stenosis of coronary artery stent, initial encounter: Secondary | ICD-10-CM | POA: Insufficient documentation

## 2015-05-26 DIAGNOSIS — I252 Old myocardial infarction: Secondary | ICD-10-CM | POA: Diagnosis not present

## 2015-05-26 DIAGNOSIS — I2510 Atherosclerotic heart disease of native coronary artery without angina pectoris: Secondary | ICD-10-CM | POA: Diagnosis not present

## 2015-05-26 DIAGNOSIS — I2584 Coronary atherosclerosis due to calcified coronary lesion: Secondary | ICD-10-CM | POA: Diagnosis not present

## 2015-05-26 DIAGNOSIS — E785 Hyperlipidemia, unspecified: Secondary | ICD-10-CM | POA: Insufficient documentation

## 2015-05-26 DIAGNOSIS — Z7982 Long term (current) use of aspirin: Secondary | ICD-10-CM | POA: Diagnosis not present

## 2015-05-26 HISTORY — PX: CARDIAC CATHETERIZATION: SHX172

## 2015-05-26 SURGERY — LEFT HEART CATH AND CORONARY ANGIOGRAPHY

## 2015-05-26 MED ORDER — ASPIRIN 81 MG PO CHEW
81.0000 mg | CHEWABLE_TABLET | ORAL | Status: AC
Start: 2015-05-26 — End: 2015-05-26
  Administered 2015-05-26: 81 mg via ORAL

## 2015-05-26 MED ORDER — HEPARIN (PORCINE) IN NACL 2-0.9 UNIT/ML-% IJ SOLN
INTRAMUSCULAR | Status: AC
  Filled 2015-05-26: qty 1000

## 2015-05-26 MED ORDER — LIDOCAINE HCL (PF) 1 % IJ SOLN
INTRAMUSCULAR | Status: DC | PRN
  Administered 2015-05-26: 3 mL

## 2015-05-26 MED ORDER — FENTANYL CITRATE (PF) 100 MCG/2ML IJ SOLN
INTRAMUSCULAR | Status: AC
  Filled 2015-05-26: qty 2

## 2015-05-26 MED ORDER — SODIUM CHLORIDE 0.9% FLUSH: 3.0000 mL | Freq: Two times a day (BID) | INTRAVENOUS | Status: DC

## 2015-05-26 MED ORDER — HEPARIN SODIUM (PORCINE) 1000 UNIT/ML IJ SOLN
INTRAMUSCULAR | Status: AC
  Filled 2015-05-26: qty 1

## 2015-05-26 MED ORDER — FENTANYL CITRATE (PF) 100 MCG/2ML IJ SOLN
INTRAMUSCULAR | Status: DC | PRN
  Administered 2015-05-26: 50 ug via INTRAVENOUS
  Administered 2015-05-26: 25 ug via INTRAVENOUS

## 2015-05-26 MED ORDER — HEPARIN (PORCINE) IN NACL 2-0.9 UNIT/ML-% IJ SOLN
INTRAMUSCULAR | Status: DC | PRN
  Administered 2015-05-26: 08:00:00

## 2015-05-26 MED ORDER — NITROGLYCERIN 1 MG/10 ML FOR IR/CATH LAB
INTRA_ARTERIAL | Status: AC
  Filled 2015-05-26: qty 10

## 2015-05-26 MED ORDER — SODIUM CHLORIDE 0.9% FLUSH: 3.0000 mL | INTRAVENOUS | Status: DC | PRN

## 2015-05-26 MED ORDER — HEPARIN SODIUM (PORCINE) 1000 UNIT/ML IJ SOLN
INTRAMUSCULAR | Status: DC | PRN
  Administered 2015-05-26: 4500 [IU] via INTRAVENOUS

## 2015-05-26 MED ORDER — VERAPAMIL HCL 2.5 MG/ML IV SOLN
INTRAVENOUS | Status: AC
  Filled 2015-05-26: qty 2

## 2015-05-26 MED ORDER — VERAPAMIL HCL 2.5 MG/ML IV SOLN
INTRAVENOUS | Status: DC | PRN
  Administered 2015-05-26: 10 mL via INTRA_ARTERIAL

## 2015-05-26 MED ORDER — IOHEXOL 350 MG/ML SOLN
INTRAVENOUS | Status: DC | PRN
  Administered 2015-05-26: 55 mL via INTRA_ARTERIAL

## 2015-05-26 MED ORDER — MIDAZOLAM HCL 2 MG/2ML IJ SOLN
INTRAMUSCULAR | Status: AC
  Filled 2015-05-26: qty 2

## 2015-05-26 MED ORDER — LIDOCAINE HCL (PF) 1 % IJ SOLN
INTRAMUSCULAR | Status: AC
  Filled 2015-05-26: qty 30

## 2015-05-26 MED ORDER — ASPIRIN 81 MG PO CHEW
CHEWABLE_TABLET | ORAL | Status: AC
  Filled 2015-05-26: qty 1

## 2015-05-26 MED ORDER — SODIUM CHLORIDE 0.9 % IV SOLN: 250.0000 mL | INTRAVENOUS | Status: DC | PRN

## 2015-05-26 MED ORDER — SODIUM CHLORIDE 0.9 % WEIGHT BASED INFUSION: 1.0000 mL/kg/h | INTRAVENOUS | Status: DC

## 2015-05-26 MED ORDER — MIDAZOLAM HCL 2 MG/2ML IJ SOLN
INTRAMUSCULAR | Status: DC | PRN
  Administered 2015-05-26: 2 mg via INTRAVENOUS
  Administered 2015-05-26: 1 mg via INTRAVENOUS

## 2015-05-26 MED ORDER — SODIUM CHLORIDE 0.9% FLUSH
3.0000 mL | Freq: Two times a day (BID) | INTRAVENOUS | Status: DC
Start: 2015-05-26 — End: 2015-05-26

## 2015-05-26 MED ORDER — SODIUM CHLORIDE 0.9 % WEIGHT BASED INFUSION
3.0000 mL/kg/h | INTRAVENOUS | Status: AC
Start: 2015-05-26 — End: 2015-05-26
  Administered 2015-05-26: 3 mL/kg/h via INTRAVENOUS

## 2015-05-26 SURGICAL SUPPLY — 12 items
CATH INFINITI 5 FR JL3.5 (CATHETERS) ×3 IMPLANT
CATH INFINITI 5FR ANG PIGTAIL (CATHETERS) ×3 IMPLANT
CATH INFINITI JR4 5F (CATHETERS) ×3 IMPLANT
DEVICE RAD COMP TR BAND LRG (VASCULAR PRODUCTS) ×3 IMPLANT
GLIDESHEATH SLEND SS 6F .021 (SHEATH) ×3 IMPLANT
KIT HEART LEFT (KITS) ×3 IMPLANT
PACK CARDIAC CATHETERIZATION (CUSTOM PROCEDURE TRAY) ×3 IMPLANT
SYR MEDRAD MARK V 150ML (SYRINGE) ×3 IMPLANT
TRANSDUCER W/STOPCOCK (MISCELLANEOUS) ×3 IMPLANT
TUBING CIL FLEX 10 FLL-RA (TUBING) ×3 IMPLANT
TUBING CONTRAST HIGH PRESS 20 (MISCELLANEOUS) ×3 IMPLANT
WIRE SAFE-T 1.5MM-J .035X260CM (WIRE) ×3 IMPLANT

## 2015-05-26 NOTE — Discharge Instructions (Signed)
Radial Site Care °Refer to this sheet in the next few weeks. These instructions provide you with information about caring for yourself after your procedure. Your health care provider may also give you more specific instructions. Your treatment has been planned according to current medical practices, but problems sometimes occur. Call your health care provider if you have any problems or questions after your procedure. °WHAT TO EXPECT AFTER THE PROCEDURE °After your procedure, it is typical to have the following: °· Bruising at the radial site that usually fades within 1-2 weeks. °· Blood collecting in the tissue (hematoma) that may be painful to the touch. It should usually decrease in size and tenderness within 1-2 weeks. °HOME CARE INSTRUCTIONS °· Take medicines only as directed by your health care provider. °· You may shower 24-48 hours after the procedure or as directed by your health care provider. Remove the bandage (dressing) and gently wash the site with plain soap and water. Pat the area dry with a clean towel. Do not rub the site, because this may cause bleeding. °· Do not take baths, swim, or use a hot tub until your health care provider approves. °· Check your insertion site every day for redness, swelling, or drainage. °· Do not apply powder or lotion to the site. °· Do not flex or bend the affected arm for 24 hours or as directed by your health care provider. °· Do not push or pull heavy objects with the affected arm for 24 hours or as directed by your health care provider. °· Do not lift over 10 lb (4.5 kg) for 5 days after your procedure or as directed by your health care provider. °· Ask your health care provider when it is okay to: °¨ Return to work or school. °¨ Resume usual physical activities or sports. °¨ Resume sexual activity. °· Do not drive home if you are discharged the same day as the procedure. Have someone else drive you. °· You may drive 24 hours after the procedure unless otherwise  instructed by your health care provider. °· Do not operate machinery or power tools for 24 hours after the procedure. °· If your procedure was done as an outpatient procedure, which means that you went home the same day as your procedure, a responsible adult should be with you for the first 24 hours after you arrive home. °· Keep all follow-up visits as directed by your health care provider. This is important. °SEEK MEDICAL CARE IF: °· You have a fever. °· You have chills. °· You have increased bleeding from the radial site. Hold pressure on the site. °SEEK IMMEDIATE MEDICAL CARE IF: °· You have unusual pain at the radial site. °· You have redness, warmth, or swelling at the radial site. °· You have drainage (other than a small amount of blood on the dressing) from the radial site. °· The radial site is bleeding, and the bleeding does not stop after 30 minutes of holding steady pressure on the site. °· Your arm or hand becomes pale, cool, tingly, or numb. °  °This information is not intended to replace advice given to you by your health care provider. Make sure you discuss any questions you have with your health care provider. °  °Document Released: 04/10/2010 Document Revised: 03/29/2014 Document Reviewed: 09/24/2013 °Elsevier Interactive Patient Education ©2016 [employer] Inc[employer] ° °

## 2015-05-26 NOTE — H&P (View-Only) (Signed)
Patient ID: Timothy Standardimothy A Paro, male   DOB: 03/26/1955, 60 y.o.   MRN: [id number]     Cardiology Office Note   Date:  04/28/2015   ID:  Timothy Standardimothy A Bracy, DOB 03/26/1955, MRN [id number]  PCP:  Malka SoJOBE,DANIEL B., MD    No chief complaint on file. f/u CAD   Wt Readings from Last 3 Encounters:  04/28/15 196 lb 6.4 oz (89.086 kg)  01/23/15 201 lb 1.9 oz (91.227 kg)  01/09/15 195 lb 1.7 oz (88.5 kg)       History of Present Illness: Timothy Hayden is a 60 y.o. male  with a hx of CAD status [place] in [place], [place] in [place]. Recently seen by Dr. Eldridge DaceVaranasi in follow-up 9/16. 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 This was abnormal and cardiac catheterization was arranged. LHC 01/08/15 demonstrated 95% distal LCx which was very small and treated medically, 75% mid LAD stenosis which was treated with a Synergy DES.   He has done well since the PCI to the LAD. He denies any chest discomfort or shortness of breath. He is exercising regularly without any symptoms. [facility], he will need repeat cardiac testing. Its unclear to me what exactly he'll need. He brought a paper showing that he'll need a nuclear stress test and a repeat cardiac cath. He states that before he returned to work after his MI in 1999, he needed a repeat cath.    Past Medical History  Diagnosis Date  . CAD (coronary artery disease)     a. PCIx1 in 1999 b. abnormal nuc, then cath 01/08/2015 DES to 75% mid LAD, 95% small distal LCx treated medically  . Hyperlipidemia   . Myocardial infarction [facility]) 1999    Past Surgical History  Procedure Laterality Date  . Endoscopic stent placement w/ mlb    . Cardiac catheterization N/A 01/08/2015    Procedure: Left Heart Cath and Coronary Angiography;  Surgeon: Corky CraftsJayadeep S Williams Dietrick, MD;  [facility];  Service: Cardiovascular;  Laterality: N/A;  . Cardiac catheterization N/A 01/08/2015    Procedure: Coronary Stent  Intervention;  Surgeon: Corky CraftsJayadeep S Jasmin Winberry, MD;  [facility];  Service: Cardiovascular;  Laterality: N/A;  mid lad 2.25x32 synergy  . Coronary angioplasty with stent placement  1999    "1 stent"  . Cardiac catheterization  1999  . Kidney surgery Right 1971    "had a blood vessel removed off my kidney"     Current Outpatient Prescriptions  Medication Sig Dispense Refill  . aspirin 81 MG tablet Take 81 mg by mouth daily.    Marland Kitchen. atorvastatin (LIPITOR) 80 MG tablet Take 1 tablet (80 mg total) by mouth at bedtime. 90 tablet 3  . clopidogrel (PLAVIX) 75 MG tablet Take 1 tablet (75 mg total) by mouth daily with breakfast. 90 tablet 3  . Ibuprofen-Famotidine (DUEXIS) 800-26.6 MG TABS Take 1 tablet by mouth daily as needed (pain).    . metoprolol (LOPRESSOR) 50 MG tablet TAKE 1 TABLET TWICE A DAY 180 tablet 3  . Multiple Vitamin (MULTI-VITAMINS) TABS Take 1 tablet by mouth daily.     . nitroGLYCERIN (NITROSTAT) 0.4 MG SL tablet Place 1 tablet (0.4 mg total) under the tongue every 5 (five) minutes as needed. 25 tablet 3  . Omega-3 Fatty Acids (FISH OIL) 1000 MG CAPS Take 1,000 mg by mouth daily.      No current facility-administered medications for this visit.  Allergies:   Review of patient's allergies indicates no known allergies.    Social History:  The patient  reports that he has never smoked. He has never used smokeless tobacco. He reports that he does not drink alcohol or use illicit drugs.   Family History:  The patient's family history includes CAD in his mother.    ROS:  Please see the history of present illness.   Otherwise, review of systems are positive for wanting to get back to work.   All other systems are reviewed and negative.    PHYSICAL EXAM: VS:  BP 124/80 mmHg  Pulse 63  Ht 5' 9" (1.753 m)  Wt 196 lb 6.4 oz (89.086 kg)  BMI 28.99 kg/m2  SpO2 99% , BMI Body mass index is 28.99 kg/(m^2). GEN: Well nourished, well developed, in no acute  distress HEENT: normal Neck: no JVD, carotid bruits, or masses Cardiac: RRR; no murmurs, rubs, or gallops,no edema  Respiratory:  clear to auscultation bilaterally, normal work of breathing GI: soft, nontender, nondistended, + BS MS: no deformity or atrophy Skin: warm and dry, no rash Neuro:  Strength and sensation are intact Psych: euthymic mood, full affect     Recent Labs: 01/09/2015: BUN 15; Creatinine, Ser 0.96; Hemoglobin 14.4; Platelets 186; Potassium 4.0; Sodium 138 03/07/2015: ALT 22   Lipid Panel    Component Value Date/Time   CHOL 140 03/07/2015 0748   TRIG 115 03/07/2015 0748   HDL 41 03/07/2015 0748   CHOLHDL 3.4 03/07/2015 0748   VLDL 23 03/07/2015 0748   LDLCALC 76 03/07/2015 0748     Other studies Reviewed: Additional studies/ records that were reviewed today with results demonstrating: cath report reviewed as above.   ASSESSMENT AND PLAN:  1. CAD: Doing well form a cardiac standpoint.   Under normal circumstances, I would not plan for any further cardiac testing. 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 We may need to also repeat cardiac cath as well. He has a small circumflex with a lesion in the distal vessel. It's unclear to me whether intervention on this vessel is required for him to go back to work. We'll try to obtain that information from his FAA physician.  He had a prior abnormal stress echo. 2.  hyperlipidemia: Continue current lipid-lowering therapy. Last LDL was 76. 3. I will talk try to talk to the [employer], Aeromedical Dept. [phone].  His FAA MD is Dr. Nicholson [phone].   4.  old MI: No signs of heart failure. Diagonal vessel was stented at that time.   Current medicines are reviewed at length with the patient today.  The patient concerns regarding his medicines were addressed.  The following changes have been made:  No change  Labs/ tests ordered today include:   Myoview No orders of the defined types were placed in this encounter.    Recommend 150 minutes/week of aerobic exercise Low fat, low carb, high fiber diet recommended  Disposition:   FU  Depending on results of stress test   Signed, Jamerica Snavely S., MD  04/28/2015 8:23 AM    [facility] [address], [place], [place]  [address] Phone: [phone]; Fax: [phone]     

## 2015-05-26 NOTE — Interval H&P Note (Signed)
Cath Lab Visit (complete for each Cath Lab visit)  Clinical Evaluation Leading to the Procedure:   ACS: No.  Non-ACS:    Anginal Classification: No symptoms  Anti-ischemic medical therapy: Minimal Therapy (1 class of medications)  Non-Invasive Test Results: No non-invasive testing performed  Prior CABG: No previous CABG   Cath needed for FAA.      History and Physical Interval Note:  05/26/2015 7:39 AM  Timothy Hayden  has presented today for surgery, with the diagnosis of abnormal stress test  The various methods of treatment have been discussed with the patient and family. After consideration of risks, benefits and other options for treatment, the patient has consented to  Procedure(s): Left Heart Cath and Coronary Angiography (N/A) as a surgical intervention .  The patient's history has been reviewed, patient examined, no change in status, stable for surgery.  I have reviewed the patient's chart and labs.  Questions were answered to the patient's satisfaction.     Kenzlie Disch S.

## 2015-06-03 ENCOUNTER — Telehealth: Payer: Self-pay

## 2015-06-03 NOTE — Telephone Encounter (Signed)
**Note De-Identified  Obfuscation** The pt needs to have a colonoscopy. [facility] needs to know:  1. If he can stop taking Asa and Plavix 7 days prior to procedure.  2. Is the pt a low, medium or high risk?

## 2015-06-04 NOTE — Telephone Encounter (Signed)
Since he had a stent on 01/08/15, I would like for him to wait before he stops his Pavix. Ideally, he would wait until 10/17.  If it is more urgent, then we can talk about the risks and benefits of stopping Plavix early.

## 2015-06-04 NOTE — Telephone Encounter (Signed)
This message has been manually faxed to [facility]. I did receive confirmation that the fax went successfully

## 2015-06-18 NOTE — Progress Notes (Signed)
Patient ID: Timothy Hayden, male   DOB: 1955-09-02, 60 y.o.   MRN: [id number]     Cardiology Office Note   Date:  06/19/2015   ID:  Timothy Hayden, DOB 1955/04/10, MRN [id number]  PCP:  Malka So., MD    No chief complaint on file. f/u CAD   Wt Readings from Last 3 Encounters:  06/19/15 199 lb 12.8 oz (90.629 kg)  05/26/15 194 lb (87.998 kg)  05/05/15 196 lb (88.905 kg)       History of Present Illness: Timothy Hayden is a 60 y.o. male  with a hx of CAD status post stenting in [place], [place] in 1999. Recently seen by Dr. Eldridge Dace in follow-up 9/16. 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 This was abnormal and cardiac catheterization was arranged. LHC 01/08/15 demonstrated 95% distal LCx which was very small and treated medically, 75% mid LAD stenosis which was treated with a Synergy DES.   He has done well since the PCI to the LAD. He denies any chest discomfort or shortness of breath. He is exercising regularly without any symptoms. Due to [employer] regulations, he had a repeat cardiac testing.  It was unchanged.  LAD stent was widely patent.  He is doing well.  No angina.  He remains active.  He is in the process of applying to teh [employer] to start work again.  He had lost some weight.  He has gained it back.    Past Medical History  Diagnosis Date  . CAD (coronary artery disease)     a. PCIx1 in 1999 b. abnormal nuc, then cath 01/08/2015 DES to 75% mid LAD, 95% small distal LCx treated medically  . Hyperlipidemia   . Myocardial infarction [facility]) 1999    Past Surgical History  Procedure Laterality Date  . Endoscopic stent placement w/ mlb    . Cardiac catheterization N/A 01/08/2015    Procedure: Left Heart Cath and Coronary Angiography;  Surgeon: Corky Crafts, MD;  Location: [facility] INVASIVE CV LAB;  Service: Cardiovascular;  Laterality: N/A;  . Cardiac catheterization N/A 01/08/2015    Procedure: Coronary Stent Intervention;  Surgeon:  Corky Crafts, MD;  Location: [facility] INVASIVE CV LAB;  Service: Cardiovascular;  Laterality: N/A;  mid lad 2.25x32 synergy  . Coronary angioplasty with stent placement  1999    "1 stent"  . Cardiac catheterization  1999  . Kidney surgery Right 1971    "had a blood vessel removed off my kidney"  . Cardiac catheterization N/A 05/26/2015    Procedure: Left Heart Cath and Coronary Angiography;  Surgeon: Corky Crafts, MD;  Location: [facility] INVASIVE CV LAB;  Service: Cardiovascular;  Laterality: N/A;     Current Outpatient Prescriptions  Medication Sig Dispense Refill  . aspirin 81 MG tablet Take 81 mg by mouth daily.    Marland Kitchen atorvastatin (LIPITOR) 80 MG tablet Take 1 tablet (80 mg total) by mouth at bedtime. 90 tablet 3  . clopidogrel (PLAVIX) 75 MG tablet Take 1 tablet (75 mg total) by mouth daily with breakfast. 90 tablet 3  . Ibuprofen-Famotidine (DUEXIS) 800-26.6 MG TABS Take 1 tablet by mouth daily as needed (pain).    . metoprolol (LOPRESSOR) 50 MG tablet TAKE 1 TABLET TWICE A DAY 180 tablet 3  . Multiple Vitamin (MULTI-VITAMINS) TABS Take 1 tablet by mouth daily.     . nitroGLYCERIN (NITROSTAT) 0.4 MG SL tablet Place 1 tablet (0.4 mg total) under the tongue every 5 (  five) minutes as needed. 25 tablet 3  . Omega-3 Fatty Acids (FISH OIL) 1000 MG CAPS Take 1,000 mg by mouth daily.      No current facility-administered medications for this visit.    Allergies:   Review of patient's allergies indicates no known allergies.    Social History:  The patient  reports that he has never smoked. He has never used smokeless tobacco. He reports that he does not drink alcohol or use illicit drugs.   Family History:  The patient's family history includes CAD in his mother.    ROS:  Please see the history of present illness.   Otherwise, review of systems are positive for wanting to get back to work.   All other systems are reviewed and negative.    PHYSICAL EXAM: VS:  BP 114/80 mmHg  Pulse 62   Ht 5\' 9"  (1.753 m)  Wt 199 lb 12.8 oz (90.629 kg)  BMI 29.49 kg/m2 , BMI Body mass index is 29.49 kg/(m^2). GEN: Well nourished, well developed, in no acute distress HEENT: normal Neck: no JVD, carotid bruits, or masses Cardiac: RRR; no murmurs, rubs, or gallops,no edema  Respiratory:  clear to auscultation bilaterally, normal work of breathing GI: soft, nontender, nondistended, + BS MS: no deformity or atrophy Skin: warm and dry, no rash Neuro:  Strength and sensation are intact Psych: euthymic mood, full affect     Recent Labs: 03/07/2015: ALT 22 05/23/2015: BUN 17; Creat 0.93; Hemoglobin 15.1; Platelets 190; Potassium 4.1; Sodium 138   Lipid Panel    Component Value Date/Time   CHOL 140 03/07/2015 0748   TRIG 115 03/07/2015 0748   HDL 41 03/07/2015 0748   CHOLHDL 3.4 03/07/2015 0748   VLDL 23 03/07/2015 0748   LDLCALC 76 03/07/2015 0748     Other studies Reviewed: Additional studies/ records that were reviewed today with results demonstrating: cath report reviewed as above.   ASSESSMENT AND PLAN:  1. CAD: Doing well form a cardiac standpoint.   [facility] no significant change.  His LAD stent is widely patent. 2.  hyperlipidemia: Continue current lipid-lowering therapy. Last LDL was 76. 3. 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 [phone].  His FAA MD is Dr. Harle StanfordNicholson [phone].  He is requesting a letter regarding general health, addressed to Michel SanteeKathy Parr. Kim with medical records has her information. 4.  Old MI: No signs of heart failure. Diagonal vessel was stented at that time.    Current medicines are reviewed at length with the patient today.  The patient concerns regarding his medicines were addressed.  The following changes have been made:  No change  Labs/ tests ordered today include:  No orders of the defined types were placed in this encounter.    Recommend 150  minutes/week of aerobic exercise Low fat, low carb, high fiber diet recommended  Disposition:   FU  In 6 months   Delorise JacksonSigned, VARANASI,JAYADEEP S., MD  06/19/2015 9:40 AM    [facility] [address], [place], [place]  [address] Phone: [phone](336) [phone]; Fax: [phone]

## 2015-06-19 ENCOUNTER — Ambulatory Visit (INDEPENDENT_AMBULATORY_CARE_PROVIDER_SITE_OTHER): Payer: BLUE CROSS/BLUE SHIELD | Admitting: Interventional Cardiology

## 2015-06-19 ENCOUNTER — Encounter: Payer: Self-pay | Admitting: Interventional Cardiology

## 2015-06-19 VITALS — BP 114/80 | HR 62 | Ht 69.0 in | Wt 199.8 lb

## 2015-06-19 DIAGNOSIS — I252 Old myocardial infarction: Secondary | ICD-10-CM | POA: Diagnosis not present

## 2015-06-19 DIAGNOSIS — I2510 Atherosclerotic heart disease of native coronary artery without angina pectoris: Secondary | ICD-10-CM

## 2015-06-19 DIAGNOSIS — E782 Mixed hyperlipidemia: Secondary | ICD-10-CM | POA: Diagnosis not present

## 2015-06-19 NOTE — Patient Instructions (Signed)
**Note De-identified  Obfuscation** Medication Instructions:  Same-no changes  Labwork: None  Testing/Procedures: None  Follow-Up: Your physician wants you to follow-up in: 6 months. You will receive a reminder letter in the mail two months in advance. If you don't receive a letter, please call our office to schedule the follow-up appointment.      If you need a refill on your cardiac medications before your next appointment, please call your pharmacy.   

## 2015-06-20 ENCOUNTER — Telehealth: Payer: Self-pay | Admitting: Interventional Cardiology

## 2015-06-20 NOTE — Telephone Encounter (Signed)
     June 20, 2015  Dear Sir/Madame:  I am writing this letter in reference to Timothy Hayden, DOB: [birth year].  I have been his cardiologist for several years. We have recently performed extensive cardiac evaluation. I find him to be in excellent general health. At our last visit on 06/19/2015, he had no cardiac symptoms. His vital signs were normal. His physical examination was normal. He appears to be in excellent physical health. I would not put any physical restrictions on him.  If you have any questions, please do not hesitate to contact me.       Corky CraftsJayadeep S Daulton Harbaugh, MD  [facility] [address] [place], [place] [address]  [phone]

## 2015-06-26 ENCOUNTER — Telehealth: Payer: Self-pay | Admitting: Interventional Cardiology

## 2015-06-26 NOTE — Telephone Encounter (Signed)
Will forward message to Dr Eldridge Dacevaranasi as Lorain ChildesFYI.

## 2015-06-26 NOTE — Telephone Encounter (Signed)
New Message  FAA requires him to get another cath. [place]. Please let Dr. Eldridge DaceVaranasi know.

## 2015-07-08 ENCOUNTER — Ambulatory Visit: Payer: BLUE CROSS/BLUE SHIELD | Admitting: Interventional Cardiology

## 2015-07-28 ENCOUNTER — Telehealth: Payer: Self-pay | Admitting: Interventional Cardiology

## 2015-07-28 NOTE — Telephone Encounter (Signed)
Please see phone note from 3/14. I have re faxed note to Dr Loman Chromanhoton again today.

## 2015-07-28 NOTE — Telephone Encounter (Signed)
New message  Request for surgical clearance:  1. What type of surgery is being performed? colonscopy  2. When is this surgery scheduled? Pending surgical clearence  3. Are there any medications that need to be held prior to surgery and how long? Plavix for 7 days  4. Name of physician performing surgery? Dr. Loman Chromanhoton  5. What is your office phone and fax number? [phone] telephone/[phone] fax/or [phone] fax  Comments: original fax was sent in March 2017. Per rep will resent fax  Message sent by/csy

## 2015-12-03 ENCOUNTER — Other Ambulatory Visit: Payer: Self-pay | Admitting: Interventional Cardiology

## 2016-01-05 ENCOUNTER — Other Ambulatory Visit: Payer: Self-pay | Admitting: Interventional Cardiology

## 2016-01-24 ENCOUNTER — Other Ambulatory Visit: Payer: Self-pay | Admitting: Physician Assistant

## 2016-01-24 DIAGNOSIS — I2510 Atherosclerotic heart disease of native coronary artery without angina pectoris: Secondary | ICD-10-CM

## 2016-01-24 DIAGNOSIS — E785 Hyperlipidemia, unspecified: Secondary | ICD-10-CM

## 2016-03-03 ENCOUNTER — Other Ambulatory Visit: Payer: Self-pay | Admitting: Interventional Cardiology

## 2016-03-11 ENCOUNTER — Other Ambulatory Visit: Payer: Self-pay | Admitting: Physician Assistant

## 2016-03-12 NOTE — Telephone Encounter (Signed)
Please review for refill. Thanks!  

## 2016-04-26 IMAGING — NM NM MISC PROCEDURE
6 series · 36 of 36 positions shown · non-contrast
Comparison: none

[Series 1: wbr_r-proj_st rest · 6.51mm/px · 6 of 64 frames shown]
[frame 6/64]
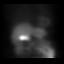
[frame 16/64]
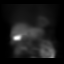
[frame 27/64]
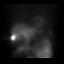
[frame 38/64]
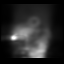
[frame 48/64]
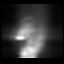
[frame 59/64]
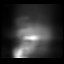

[Series 1: rest · 6.51mm/px · 6 of 64 frames shown]
[frame 6/64]
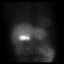
[frame 16/64]
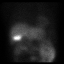
[frame 27/64]
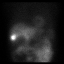
[frame 38/64]
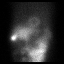
[frame 48/64]
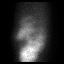
[frame 59/64]
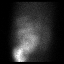

[Series 2: stress · 6.51mm/px · 6 of 512 frames shown (1 of 2)]
[frame 43/512]
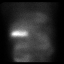
[frame 128/512]
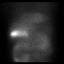
[frame 214/512]
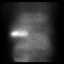
[frame 299/512]
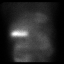
[frame 384/512]
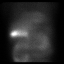
[frame 470/512]
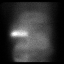

[Series 2: wbr_s-proj_st stress · 6.51mm/px · 6 of 64 frames shown (1 of 2)]
[frame 6/64]
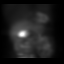
[frame 16/64]
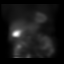
[frame 27/64]
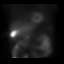
[frame 38/64]
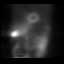
[frame 48/64]
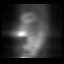
[frame 59/64]
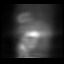

[Series 2: stress · 6.51mm/px · 6 of 64 frames shown (2 of 2)]
[frame 6/64]
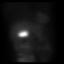
[frame 16/64]
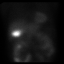
[frame 27/64]
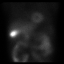
[frame 38/64]
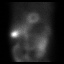
[frame 48/64]
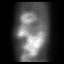
[frame 59/64]
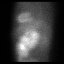

[Series 2: wbr_s-proj_st stress · 6.51mm/px · 6 of 512 frames shown (2 of 2)]
[frame 43/512]
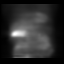
[frame 128/512]
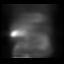
[frame 214/512]
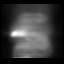
[frame 299/512]
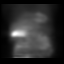
[frame 384/512]
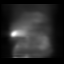
[frame 470/512]
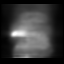

[36 of 36 positions shown; findings below may reference images not displayed]

Canned report from images found in remote index.

Refer to host system for actual result text.

## 2016-05-14 ENCOUNTER — Encounter: Payer: Self-pay | Admitting: Interventional Cardiology

## 2016-05-14 ENCOUNTER — Telehealth: Payer: Self-pay | Admitting: Interventional Cardiology

## 2016-05-14 ENCOUNTER — Encounter (INDEPENDENT_AMBULATORY_CARE_PROVIDER_SITE_OTHER): Payer: Self-pay

## 2016-05-14 ENCOUNTER — Ambulatory Visit (INDEPENDENT_AMBULATORY_CARE_PROVIDER_SITE_OTHER): Payer: BLUE CROSS/BLUE SHIELD | Admitting: Interventional Cardiology

## 2016-05-14 VITALS — BP 110/78 | HR 59 | Ht 69.0 in | Wt 199.2 lb

## 2016-05-14 DIAGNOSIS — I2510 Atherosclerotic heart disease of native coronary artery without angina pectoris: Secondary | ICD-10-CM

## 2016-05-14 DIAGNOSIS — E782 Mixed hyperlipidemia: Secondary | ICD-10-CM | POA: Diagnosis not present

## 2016-05-14 DIAGNOSIS — I252 Old myocardial infarction: Secondary | ICD-10-CM | POA: Diagnosis not present

## 2016-05-14 NOTE — Progress Notes (Signed)
Patient ID: Timothy Hayden, male   DOB: [birth year], 61 y.o.   MRN: [id number]     Cardiology Office Note   Date:  05/14/2016   ID:  Timothy Hayden, DOB 1955/12/14, MRN [id number]  PCP:  Malka So., MD    No chief complaint on file. f/u CAD   Wt Readings from Last 3 Encounters:  05/14/16 199 lb 3.2 oz (90.4 kg)  06/19/15 199 lb 12.8 oz (90.6 kg)  05/26/15 194 lb (88 kg)       History of Present Illness: Timothy Hayden is a 61 y.o. male  with a hx of CAD status post stenting in [place], [place] in 1999. Recently seen by Dr. Eldridge Dace in follow-up 9/16. 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 This was abnormal and cardiac catheterization was arranged. LHC 01/08/15 demonstrated 95% distal LCx which was very small and treated medically, 75% mid LAD stenosis which was treated with a Synergy DES.   He has done well since the PCI to the LAD. He denies any chest discomfort or shortness of breath. He is exercising regularly without any symptoms. Due to [employer] regulations, he had a repeat cardiac testing.  It was unchanged.  LAD stent was widely patent.  He is doing well.  No angina.  He remains active.  He is in the process of applying to teh [employer] to start work again.  He had lost some weight.  He has gained it back.  He saw an FAA cardiologist who agreed to fix the circumflex and he had 2 stents placed.    He has gone back to work.  He exercises a little with walking.  Knee pain limits this.  He uses the treadmill as well.      Past Medical History:  Diagnosis Date  . CAD (coronary artery disease)    a. PCIx1 in 1999 b. abnormal nuc, then cath 01/08/2015 DES to 75% mid LAD, 95% small distal LCx treated medically  . Hyperlipidemia   . Myocardial infarction 1999    Past Surgical History:  Procedure Laterality Date  . CARDIAC CATHETERIZATION N/A 01/08/2015   Procedure: Left Heart Cath and Coronary Angiography;  Surgeon: Corky Crafts, MD;   Location: [facility] INVASIVE CV LAB;  Service: Cardiovascular;  Laterality: N/A;  . CARDIAC CATHETERIZATION N/A 01/08/2015   Procedure: Coronary Stent Intervention;  Surgeon: Corky Crafts, MD;  Location: [facility] INVASIVE CV LAB;  Service: Cardiovascular;  Laterality: N/A;  mid lad 2.25x32 synergy  . CARDIAC CATHETERIZATION  1999  . CARDIAC CATHETERIZATION N/A 05/26/2015   Procedure: Left Heart Cath and Coronary Angiography;  Surgeon: Corky Crafts, MD;  Location: [facility] INVASIVE CV LAB;  Service: Cardiovascular;  Laterality: N/A;  . CORONARY ANGIOPLASTY WITH STENT PLACEMENT  1999   "1 stent"  . ENDOSCOPIC STENT PLACEMENT W/ MLB    . KIDNEY SURGERY Right 1971   "had a blood vessel removed off my kidney"     Current Outpatient Prescriptions  Medication Sig Dispense Refill  . aspirin 81 MG tablet Take 81 mg by mouth daily.    Marland Kitchen atorvastatin (LIPITOR) 80 MG tablet TAKE 1 TABLET AT BEDTIME 90 tablet 1  . clopidogrel (PLAVIX) 75 MG tablet TAKE 1 TABLET (75 MG TOTAL) BY MOUTH DAILY WITH BREAKFAST. 90 tablet 2  . metoprolol (LOPRESSOR) 50 MG tablet Take 1 tablet (50 mg total) by mouth 2 (two) times daily. *Please call and schedule a one year follow up appointment* 180  tablet 0  . Multiple Vitamin (MULTI-VITAMINS) TABS Take 1 tablet by mouth daily.     . nitroGLYCERIN (NITROSTAT) 0.4 MG SL tablet TAKE 1 TABLET UNDER THE TONGUE EVERY 5 MINUTES AS NEEDED 25 tablet 3  . Omega-3 Fatty Acids (FISH OIL) 1000 MG CAPS Take 1,000 mg by mouth daily.      No current facility-administered medications for this visit.     Allergies:   Patient has no known allergies.    Social History:  The patient  reports that he has never smoked. He has never used smokeless tobacco. He reports that he does not drink alcohol or use drugs.   Family History:  The patient's family history includes CAD in his mother.    ROS:  Please see the history of present illness.   Otherwise, review of systems are positive for knee pain.    All other systems are reviewed and negative.    PHYSICAL EXAM: VS:  BP 110/78 (BP Location: Left Arm, Patient Position: Sitting, Cuff Size: Normal)   Pulse (!) 59   Ht 5\' 9"  (1.753 m)   Wt 199 lb 3.2 oz (90.4 kg)   SpO2 93%   BMI 29.42 kg/m  , BMI Body mass index is 29.42 kg/m. GEN: Well nourished, well developed, in no acute distress  HEENT: normal  Neck: no JVD, carotid bruits, or masses Cardiac: RRR; no murmurs, rubs, or gallops,no edema  Respiratory:  clear to auscultation bilaterally, normal work of breathing GI: soft, nontender, nondistended, + BS MS: no deformity or atrophy  Skin: warm and dry, no rash Neuro:  Strength and sensation are intact Psych: euthymic mood, full affect     Recent Labs: 05/23/2015: BUN 17; Creat 0.93; Hemoglobin 15.1; Platelets 190; Potassium 4.1; Sodium 138   Lipid Panel    Component Value Date/Time   CHOL 140 03/07/2015 0748   TRIG 115 03/07/2015 0748   HDL 41 03/07/2015 0748   CHOLHDL 3.4 03/07/2015 0748   VLDL 23 03/07/2015 0748   LDLCALC 76 03/07/2015 0748     Other studies Reviewed: Additional studies/ records that were reviewed today with results demonstrating: cath report reviewed as above. Outside labs.    ASSESSMENT AND PLAN:  1. CAD: Doing well form a cardiac standpoint.   [facility] no significant change.  His LAD stent is widely patent.  [place] with [place] MD in [place].  No angina.  Continue aggressive secondary prevention.  2.  hyperlipidemia: Continue current lipid-lowering therapy. Last LDL was 73 in Jan 2018.  3.  Old MI: No signs of heart failure. Diagonal vessel was stented at that time.    Current medicines are reviewed at length with the patient today.  The patient concerns regarding his medicines were addressed.  The following changes have been made:  No change  Labs/ tests ordered today include:  No orders of the defined types were placed in this  encounter.   Recommend 150 minutes/week of aerobic exercise Low fat, low carb, high fiber diet recommended  Disposition:   FU  In 12 months   Signed, Lance MussJayadeep Destin Kittler, MD  05/14/2016 1:59 PM    [facility] [address], [place], [place]  [address] Phone: [phone](336) [phone]; Fax: [phone]

## 2016-05-14 NOTE — Telephone Encounter (Signed)
New message   Pt verbalized that he forgot to ask some questions about a procedure  Please call pt

## 2016-05-14 NOTE — Telephone Encounter (Signed)
**Note De-Identified  Obfuscation** The pt states that his PCP has recommended that he have a colonoscopy and has referred him to gastroenterology. He wants to know when he should stop taking Plavix prior to his colonoscopy. He is advised that once he sees his gastroenterologist they will contact our office with a request for cardiac clearance and instructions on when he should stop taking his plavix prior to colonoscopy.  He verbalized understanding and was appreciative of call.

## 2016-05-14 NOTE — Patient Instructions (Signed)

## 2016-06-02 ENCOUNTER — Other Ambulatory Visit: Payer: Self-pay | Admitting: Interventional Cardiology

## 2016-07-30 ENCOUNTER — Other Ambulatory Visit: Payer: Self-pay | Admitting: Physician Assistant

## 2016-07-30 DIAGNOSIS — I2510 Atherosclerotic heart disease of native coronary artery without angina pectoris: Secondary | ICD-10-CM

## 2016-07-30 DIAGNOSIS — E785 Hyperlipidemia, unspecified: Secondary | ICD-10-CM

## 2016-10-03 ENCOUNTER — Other Ambulatory Visit: Payer: Self-pay | Admitting: Interventional Cardiology

## 2017-03-30 ENCOUNTER — Other Ambulatory Visit: Payer: Self-pay | Admitting: Interventional Cardiology

## 2017-06-12 ENCOUNTER — Other Ambulatory Visit: Payer: Self-pay | Admitting: Interventional Cardiology

## 2017-06-12 ENCOUNTER — Other Ambulatory Visit: Payer: Self-pay | Admitting: Physician Assistant

## 2017-06-12 DIAGNOSIS — E785 Hyperlipidemia, unspecified: Secondary | ICD-10-CM

## 2017-06-12 DIAGNOSIS — I2510 Atherosclerotic heart disease of native coronary artery without angina pectoris: Secondary | ICD-10-CM

## 2017-06-14 NOTE — Progress Notes (Signed)
Cardiology Office Note   Date:  06/15/2017   ID:  SHAVAR GORKA, DOB 04/23/1955, MRN [id number]  PCP:  Malka So., MD    No chief complaint on file.  CAD  Wt Readings from Last 3 Encounters:  06/15/17 180 lb (81.6 kg)  05/14/16 199 lb 3.2 oz (90.4 kg)  06/19/15 199 lb 12.8 oz (90.6 kg)       History of Present Illness: Timothy Hayden is a 62 y.o. male   with a hx of CAD status post stenting in [place], [place] in 1999.  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 This was abnormal and cardiac catheterization was arranged. LHC 01/08/15 demonstrated 95% distal LCx which was very small and treated medically, 75% mid LAD stenosis which was treated with a Synergy DES.   He has done well since the PCI to the LAD. He denies any chest discomfort or shortness of breath. He is exercising regularly without any symptoms. Due to [employer] regulations, he had a repeat cardiac testing.  It was unchanged.  LAD stent was widely patent.   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    He has chronic knee pain that limit his exercise.  He tries to avoid walking but will try a stationary bike.         Past Medical History:  Diagnosis Date  . CAD (coronary artery disease)    a. PCIx1 in 1999 b. abnormal nuc, then cath 01/08/2015 DES to 75% mid LAD, 95% small distal LCx treated medically  . Hyperlipidemia   . Myocardial infarction (HCC) 1999    Past Surgical History:  Procedure Laterality Date  . CARDIAC CATHETERIZATION N/A 01/08/2015   Procedure: Left Heart Cath and Coronary Angiography;  Surgeon: Corky Crafts, MD;  Location: [facility] INVASIVE CV LAB;  Service: Cardiovascular;  Laterality: N/A;  . CARDIAC CATHETERIZATION N/A 01/08/2015   Procedure: Coronary Stent Intervention;  Surgeon: Corky Crafts, MD;  [facility] INVASIVE CV LAB;  Service:  Cardiovascular;  Laterality: N/A;  mid lad 2.25x32 synergy  . CARDIAC CATHETERIZATION  1999  . CARDIAC CATHETERIZATION N/A 05/26/2015   Procedure: Left Heart Cath and Coronary Angiography;  Surgeon: Corky Crafts, MD;  Location: [facility] INVASIVE CV LAB;  Service: Cardiovascular;  Laterality: N/A;  . CORONARY ANGIOPLASTY WITH STENT PLACEMENT  1999   "1 stent"  . ENDOSCOPIC STENT PLACEMENT W/ MLB    . KIDNEY SURGERY Right 1971   "had a blood vessel removed off my kidney"     Current Outpatient Medications  Medication Sig Dispense Refill  . atorvastatin (LIPITOR) 80 MG tablet Take 1 tablet (80 mg total) by mouth at bedtime. 90 tablet 2  . clopidogrel (PLAVIX) 75 MG tablet Take 1 tablet (75 mg total) by mouth daily with breakfast. Please call and schedule an appointment for further refills 1st attempt 90 tablet 0  . metoprolol (LOPRESSOR) 50 MG tablet Take 1 tablet (50 mg total) by mouth 2 (two) times daily. 180 tablet 3  . Multiple Vitamin (MULTI-VITAMINS) TABS Take 1 tablet by mouth daily.     . nitroGLYCERIN (NITROSTAT) 0.4 MG SL tablet Place 0.4 mg under the tongue every 5 (five) minutes as needed for chest pain.    . Omega-3 Fatty Acids (FISH OIL) 1000 MG CAPS Take 1,000 mg by mouth daily.  No current facility-administered medications for this visit.     Allergies:   Patient has no known allergies.    Social History:  The patient  reports that he has never smoked. He has never used smokeless tobacco. He reports that he does not drink alcohol or use drugs.   Family History:  The patient's family history includes CAD in his mother.    ROS:  Please see the history of present illness.   Otherwise, review of systems are positive for knee pain.   All other systems are reviewed and negative.    PHYSICAL EXAM: VS:  BP 104/72   Pulse (!) 56   Ht 5\' 9"  (1.753 m)   Wt 180 lb (81.6 kg)   SpO2 97%   BMI 26.58 kg/m  , BMI Body mass index is 26.58 kg/m. GEN: Well nourished, well  developed, in no acute distress  HEENT: normal  Neck: no JVD, carotid bruits, or masses Cardiac: RRR; no murmurs, rubs, or gallops,no edema  Respiratory:  clear to auscultation bilaterally, normal work of breathing GI: soft, nontender, nondistended, + BS MS: no deformity or atrophy  Skin: warm and dry, no rash Neuro:  Strength and sensation are intact Psych: euthymic mood, full affect   EKG:   The ekg ordered today demonstrates sinus bradycardia, no ST changes   Recent Labs: No results found for requested labs within last 8760 hours.   Lipid Panel    Component Value Date/Time   CHOL 140 03/07/2015 0748   TRIG 115 03/07/2015 0748   HDL 41 03/07/2015 0748   CHOLHDL 3.4 03/07/2015 0748   VLDL 23 03/07/2015 0748   LDLCALC 76 03/07/2015 0748     Other studies Reviewed: Additional studies/ records that were reviewed today with results demonstrating: 10/18 lipids reviewed in Care Everywhere.   ASSESSMENT AND PLAN:  1. CAD: No angina.  He [place] and it was normal.  He gets his stress test done with an airline MD.  Continue aggressive secondary prevention including clopidogrel monotherapy. 2. Hyperlipidemia: Labs from October 2018 reviewed.  Continue current lipid-lowering therapy. 3. Old MI: No CHF sx.  4. Try to increase exercise to target below.    Current medicines are reviewed at length with the patient today.  The patient concerns regarding his medicines were addressed.  The following changes have been made:  No change  Labs/ tests ordered today include:  No orders of the defined types were placed in this encounter.   Recommend 150 minutes/week of aerobic exercise Low fat, low carb, high fiber diet recommended  Disposition:   FU in 1 year   Signed, Lance MussJayadeep Vernon Ariel, MD  06/15/2017 10:42 AM    [facility] [address], [place], [place]  [address] Phone: [phone]([phone]; Fax: [phone]

## 2017-06-15 ENCOUNTER — Encounter: Payer: Self-pay | Admitting: Interventional Cardiology

## 2017-06-15 ENCOUNTER — Ambulatory Visit (INDEPENDENT_AMBULATORY_CARE_PROVIDER_SITE_OTHER): Payer: BLUE CROSS/BLUE SHIELD | Admitting: Interventional Cardiology

## 2017-06-15 VITALS — BP 104/72 | HR 56 | Ht 69.0 in | Wt 180.0 lb

## 2017-06-15 DIAGNOSIS — I2510 Atherosclerotic heart disease of native coronary artery without angina pectoris: Secondary | ICD-10-CM | POA: Diagnosis not present

## 2017-06-15 DIAGNOSIS — I252 Old myocardial infarction: Secondary | ICD-10-CM

## 2017-06-15 DIAGNOSIS — E782 Mixed hyperlipidemia: Secondary | ICD-10-CM

## 2017-06-15 NOTE — Patient Instructions (Signed)

## 2017-06-20 ENCOUNTER — Other Ambulatory Visit: Payer: Self-pay | Admitting: Interventional Cardiology

## 2017-07-08 ENCOUNTER — Other Ambulatory Visit: Payer: Self-pay | Admitting: Interventional Cardiology

## 2017-07-08 ENCOUNTER — Other Ambulatory Visit: Payer: Self-pay | Admitting: Physician Assistant

## 2017-07-08 DIAGNOSIS — E785 Hyperlipidemia, unspecified: Secondary | ICD-10-CM

## 2017-07-08 DIAGNOSIS — I2510 Atherosclerotic heart disease of native coronary artery without angina pectoris: Secondary | ICD-10-CM

## 2017-11-04 ENCOUNTER — Telehealth: Payer: Self-pay | Admitting: Interventional Cardiology

## 2017-11-04 NOTE — Telephone Encounter (Signed)
° °  [facility] Pre-operative Risk Assessment    Request for surgical clearance:  1. What type of surgery is being performed? Colonoscopy    2. When is this surgery scheduled?  TBD  3. What type of clearance is required (medical clearance vs. Pharmacy clearance to hold med vs. Both)? Both   4. Are there any medications that need to be held prior to surgery and how long? Plavix, 5-7 days prior to the procedure.    5. Practice name and name of physician performing surgery? [facility], Dr. Dorrene German.  6. What is your office phone number [phone]   7.   What is your office fax number [phone]  8.   Anesthesia type (None, local, MAC, general) ? Not listed.    Timothy Hayden 11/04/2017, 12:45 PM  _________________________________________________________________   (provider comments below)

## 2017-11-09 NOTE — Telephone Encounter (Signed)
   Primary Cardiologist: Lance MussJayadeep Varanasi, MD  Chart reviewed as part of pre-operative protocol coverage. Patient was contacted 11/09/2017 in reference to pre-operative risk assessment for pending surgery as outlined below.  Timothy Hayden was last seen on 06/15/17 by Dr. Eldridge DaceVaranasi.  He has known CAD s/p prior stenting. Last intervention was PCI to LCx in 2017. He works as an Buyer, retailairline pilot. He had an ETT in 8/18 in [place] and it was normal.  He gets his stress test done with an airline MD.  Phone call placed to Pt. LVM instructing pt to call back for clearance. I will also route to Dr. Eldridge DaceVaranasi to see if ok for pt to hold Plavix for 5-7 days, as requested by GI.   Robbie LisBrittainy Simmons, PA-C 11/09/2017, 3:49 PM

## 2017-11-09 NOTE — Telephone Encounter (Signed)
OK to hold Plavix for 5 days prior to colonoscopy.

## 2017-11-10 NOTE — Telephone Encounter (Signed)
   Primary Cardiologist:Jayadeep Eldridge DaceVaranasi, MD  Chart reviewed as part of pre-operative protocol coverage. I contacted pt by phone. No anginal symptoms. Denies exertional CP and dyspnea. See additional notes below.  He is considered low risk for planned procedure and may proceed w/o need for further cardiac w/u. Per Dr. Eldridge DaceVaranasi, it is ok to hold Plavix 5 days prior to procedure. -  Will route this bundled recommendation to requesting provider via Epic fax function. Please call with questions.  Robbie LisBrittainy Rodolfo Notaro, PA-C 11/10/2017, 5:01 PM

## 2018-06-20 ENCOUNTER — Ambulatory Visit: Payer: BLUE CROSS/BLUE SHIELD | Admitting: Physician Assistant

## 2018-06-23 ENCOUNTER — Other Ambulatory Visit: Payer: Self-pay | Admitting: Physician Assistant

## 2018-06-23 ENCOUNTER — Other Ambulatory Visit: Payer: Self-pay | Admitting: Interventional Cardiology

## 2018-06-23 DIAGNOSIS — I2510 Atherosclerotic heart disease of native coronary artery without angina pectoris: Secondary | ICD-10-CM

## 2018-06-23 DIAGNOSIS — E785 Hyperlipidemia, unspecified: Secondary | ICD-10-CM

## 2018-07-05 ENCOUNTER — Telehealth: Payer: Self-pay

## 2018-07-05 NOTE — Telephone Encounter (Signed)
Pt declined eviit. He wants the JV to listen to his heart and is willing to wait until August to come in.

## 2018-07-11 NOTE — Telephone Encounter (Signed)
     Primary Cardiologist:  Lance Muss, MD   Patient contacted.  History reviewed.  No symptoms to suggest any unstable cardiac conditions. Patient refuses Virtual Visit. Patient rescheduled for 8/25. If symptoms change, he has been instructed to contact our office.    Lattie Haw, RN  07/11/2018 9:15 AM         .

## 2018-07-13 ENCOUNTER — Ambulatory Visit: Payer: BLUE CROSS/BLUE SHIELD | Admitting: Interventional Cardiology

## 2018-11-12 NOTE — Progress Notes (Signed)
Cardiology Office Note   Date:  11/14/2018   ID:  Timothy Hayden, DOB 09/29/1955, MRN [id number]  PCP:  Malka SoJobe, Daniel B., MD    No chief complaint on file.  CAD  Wt Readings from Last 3 Encounters:  11/14/18 189 lb (85.7 kg)  06/15/17 180 lb (81.6 kg)  05/14/16 199 lb 3.2 oz (90.4 kg)       History of Present Illness: Timothy Hayden is a 63 y.o. male  with a hx of CAD status post stenting in [place] in 1999.  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 This was abnormal and cardiac catheterization was arranged. LHC 01/08/15 demonstrated 95% distal LCx which was very small and treated medically, 75% mid LAD stenosis which was treated with a Synergy DES.   He has done well since the PCI to the LAD. He denies any chest discomfort or shortness of breath. He is exercising regularly without any symptoms. Due to [employer] regulations, he had a repeat cardiac testing. It was unchanged. LAD stent was widely patent.   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   He has chronic knee pain that limit his exercise.  He tries to avoid walking but will try a stationary bike.    Denies : Chest pain. Dizziness. Leg edema. Nitroglycerin use. Orthopnea. Palpitations. Paroxysmal nocturnal dyspnea. Shortness of breath. Syncope.   Exercise limited since COVID19.    He has retired from Scientist, clinical (histocompatibility and immunogenetics)the airline.  He wants to keep his license.    Noticing some issues with erectile dysfunction.  No NTG usage.     Past Medical History:  Diagnosis Date  . CAD (coronary artery disease)    a. PCIx1 in 1999 b. abnormal nuc, then cath 01/08/2015 DES to 75% mid LAD, 95% small distal LCx treated medically  . Hyperlipidemia   . Myocardial infarction (HCC) 1999    Past Surgical History:  Procedure Laterality Date  . CARDIAC CATHETERIZATION N/A 01/08/2015   Procedure: Left Heart  Cath and Coronary Angiography;  Surgeon: Corky CraftsJayadeep S Marlyss Cissell, MD;  [facility];  Service: Cardiovascular;  Laterality: N/A;  . CARDIAC CATHETERIZATION N/A 01/08/2015   Procedure: Coronary Stent Intervention;  Surgeon: Corky CraftsJayadeep S Justinian Miano, MD;  [facility];  Service: Cardiovascular;  Laterality: N/A;  mid lad 2.25x32 synergy  . CARDIAC CATHETERIZATION  1999  . CARDIAC CATHETERIZATION N/A 05/26/2015   Procedure: Left Heart Cath and Coronary Angiography;  Surgeon: Corky CraftsJayadeep S Kahil Agner, MD;  [facility];  Service: Cardiovascular;  Laterality: N/A;  . CORONARY ANGIOPLASTY WITH STENT PLACEMENT  1999   "1 stent"  . ENDOSCOPIC STENT PLACEMENT W/ MLB    . KIDNEY SURGERY Right 1971   "had a blood vessel removed off my kidney"     Current Outpatient Medications  Medication Sig Dispense Refill  . atorvastatin (LIPITOR) 80 MG tablet TAKE 1 TABLET (80 MG TOTAL) BY MOUTH AT BEDTIME. 90 tablet 1  . clopidogrel (PLAVIX) 75 MG tablet TAKE 1 TABLET BY MOUTH EVERY DAY 90 tablet 3  . metoprolol tartrate (LOPRESSOR) 50 MG tablet TAKE 1 TABLET (50 MG TOTAL) BY MOUTH 2 (TWO) TIMES DAILY. 180 tablet 3  . Multiple Vitamin (MULTI-VITAMINS) TABS Take 1 tablet by mouth daily.     . nitroGLYCERIN (NITROSTAT) 0.4 MG SL tablet Place 0.4 mg  under the tongue every 5 (five) minutes as needed for chest pain.    . Omega-3 Fatty Acids (FISH OIL) 1000 MG CAPS Take 1,000 mg by mouth daily.      No current facility-administered medications for this visit.     Allergies:   Patient has no known allergies.    Social History:  The patient  reports that he has never smoked. He has never used smokeless tobacco. He reports that he does not drink alcohol or use drugs.   Family History:  The patient's family history includes CAD in his mother.    ROS:  Please see the history of present illness.   Otherwise, review of systems are positive for knee pain, mild sx of erectile dysfunction.    All other systems are reviewed and negative.    PHYSICAL EXAM: VS:  BP 138/80   Pulse (!) 58   Ht 5\' 9"  (1.753 m)   Wt 189 lb (85.7 kg)   SpO2 98%   BMI 27.91 kg/m  , BMI Body mass index is 27.91 kg/m. GEN: Well nourished, well developed, in no acute distress  HEENT: normal  Neck: no JVD, carotid bruits, or masses Cardiac: RRR; no murmurs, rubs, or gallops,no edema  Respiratory:  clear to auscultation bilaterally, normal work of breathing GI: soft, nontender, nondistended, + BS, umbilical hernia MS: no deformity or atrophy  Skin: warm and dry, no rash Neuro:  Strength and sensation are intact Psych: euthymic mood, full affect   EKG:   The ekg ordered today demonstrates sinus bradycardia, no ST changes   Recent Labs: No results found for requested labs within last 8760 hours.   Lipid Panel    Component Value Date/Time   CHOL 140 03/07/2015 0748   TRIG 115 03/07/2015 0748   HDL 41 03/07/2015 0748   CHOLHDL 3.4 03/07/2015 0748   VLDL 23 03/07/2015 0748   LDLCALC 76 03/07/2015 0748     Other studies Reviewed: Additional studies/ records that were reviewed today with results demonstrating: as noted above.   ASSESSMENT AND PLAN:  1. CAD: ETT in 8/18 in [place] and it was normal.  No angina.  Continue aggressive secondary prevention.  May need another stress test, possibly nuclear.  He will check and let us know.  2. Hyperlipidemia: Will check labs today.  Continue statin. 3. Old MI: No CHF.   4. Erectile dysfunction: Will try Sildenafil 60-100 mg dose.     Current medicines are reviewed at length with the patient today.  The patient concerns regarding his medicines were addressed.  The following changes have been made:  No change  Labs/ tests ordered today include:  No orders of the defined types were placed in this encounter.   Recommend 150 minutes/week of aerobic exercise Low fat, low carb, high fiber diet recommended  Disposition:   FU in 1 year    Signed, Larae Grooms, MD  11/14/2018 9:47 AM    [facility] [address], [place], [place]  [address] Phone: [phone]; Fax: [phone]

## 2018-11-14 ENCOUNTER — Encounter: Payer: Self-pay | Admitting: Interventional Cardiology

## 2018-11-14 ENCOUNTER — Other Ambulatory Visit: Payer: Self-pay

## 2018-11-14 ENCOUNTER — Encounter (INDEPENDENT_AMBULATORY_CARE_PROVIDER_SITE_OTHER): Payer: Self-pay

## 2018-11-14 ENCOUNTER — Ambulatory Visit (INDEPENDENT_AMBULATORY_CARE_PROVIDER_SITE_OTHER): Payer: BC Managed Care – PPO | Admitting: Interventional Cardiology

## 2018-11-14 VITALS — BP 138/80 | HR 58 | Ht 69.0 in | Wt 189.0 lb

## 2018-11-14 DIAGNOSIS — I252 Old myocardial infarction: Secondary | ICD-10-CM | POA: Diagnosis not present

## 2018-11-14 DIAGNOSIS — E782 Mixed hyperlipidemia: Secondary | ICD-10-CM | POA: Diagnosis not present

## 2018-11-14 DIAGNOSIS — I2510 Atherosclerotic heart disease of native coronary artery without angina pectoris: Secondary | ICD-10-CM | POA: Diagnosis not present

## 2018-11-14 DIAGNOSIS — N529 Male erectile dysfunction, unspecified: Secondary | ICD-10-CM | POA: Diagnosis not present

## 2018-11-14 LAB — CBC
Hematocrit: 44.3 % (ref 37.5–51.0)
Hemoglobin: 14.6 g/dL (ref 13.0–17.7)
MCH: 28.5 pg (ref 26.6–33.0)
MCHC: 33 g/dL (ref 31.5–35.7)
MCV: 87 fL (ref 79–97)
Platelets: 214 10*3/uL (ref 150–450)
RBC: 5.12 x10E6/uL (ref 4.14–5.80)
RDW: 12 % (ref 11.6–15.4)
WBC: 4.5 10*3/uL (ref 3.4–10.8)

## 2018-11-14 LAB — COMPREHENSIVE METABOLIC PANEL
ALT: 19 IU/L (ref 0–44)
AST: 17 IU/L (ref 0–40)
Albumin/Globulin Ratio: 1.9 (ref 1.2–2.2)
Albumin: 4.3 g/dL (ref 3.8–4.8)
Alkaline Phosphatase: 91 IU/L (ref 39–117)
BUN/Creatinine Ratio: 20 (ref 10–24)
BUN: 17 mg/dL (ref 8–27)
Bilirubin Total: 0.4 mg/dL (ref 0.0–1.2)
CO2: 22 mmol/L (ref 20–29)
Calcium: 9.3 mg/dL (ref 8.6–10.2)
Chloride: 102 mmol/L (ref 96–106)
Creatinine, Ser: 0.85 mg/dL (ref 0.76–1.27)
GFR calc Af Amer: 106 mL/min/{1.73_m2} (ref >59)
GFR calc non Af Amer: 92 mL/min/{1.73_m2} (ref >59)
Globulin, Total: 2.3 g/dL (ref 1.5–4.5)
Glucose: 93 mg/dL (ref 65–99)
Potassium: 4.4 mmol/L (ref 3.5–5.2)
Sodium: 140 mmol/L (ref 134–144)
Total Protein: 6.6 g/dL (ref 6.0–8.5)

## 2018-11-14 LAB — LIPID PANEL
Chol/HDL Ratio: 2.9 ratio (ref 0.0–5.0)
Cholesterol, Total: 121 mg/dL (ref 100–199)
HDL: 42 mg/dL (ref >39)
LDL Calculated: 64 mg/dL (ref 0–99)
Triglycerides: 74 mg/dL (ref 0–149)
VLDL Cholesterol Cal: 15 mg/dL (ref 5–40)

## 2018-11-14 MED ORDER — SILDENAFIL CITRATE 20 MG PO TABS: ORAL_TABLET | ORAL | 0 refills | Status: DC

## 2018-11-14 MED ORDER — NITROGLYCERIN 0.4 MG SL SUBL: 0.4000 mg | SUBLINGUAL_TABLET | SUBLINGUAL | 2 refills | Status: AC | PRN

## 2018-11-14 NOTE — Patient Instructions (Addendum)
Medication Instructions:  Your physician has recommended you make the following change in your medication: START SILDENAFIL AS NEEDED PER INSTRUCTIONS  If you need a refill on your cardiac medications before your next appointment, please call your pharmacy.   Lab work: CMET LIPIDS CBC  If you have labs (blood work) drawn today and your tests are completely normal, you will receive your results only by: Marland Kitchen. MyChart Message (if you have MyChart) OR . A paper copy in the mail If you have any lab test that is abnormal or we need to change your treatment, we will call you to review the results.  Testing/Procedures: none  Follow-Up: At [facility], you and your health needs are our priority.  As part of our continuing mission to provide you with exceptional heart care, we have created designated Provider Care Teams.  These Care Teams include your primary Cardiologist (physician) and Advanced Practice Providers (APPs -  Physician Assistants and Nurse Practitioners) who all work together to provide you with the care you need, when you need it. You will need a follow up appointment in 12 months.  We will call you 2 months in advance to schedule this appointment.  You may see Lance MussJayadeep Varanasi, MD or one of the following Advanced Practice Providers on your designated Care Team:   [place] LorenzoBrittainy Simmons, PA-C Ronie Spiesayna Dunn, PA-C . Jacolyn ReedyMichele Lenze, PA-C  Any Other Special Instructions Will Be Listed Below (If Applicable).  Sildenafil tablets (Erectile Dysfunction) What is this medicine? SILDENAFIL (sil DEN a fil) is used to treat erection problems in men. This medicine may be used for other purposes; ask your health care provider or pharmacist if you have questions. COMMON BRAND NAME(S): Viagra What should I tell my health care provider before I take this medicine? They need to know if you have any of these conditions:  bleeding disorders  eye or vision problems, including a rare inherited eye  disease called retinitis pigmentosa  anatomical deformation of the penis, Peyronie's disease, or history of priapism (painful and prolonged erection)  heart disease, angina, a history of heart attack, irregular heart beats, or other heart problems  high or low blood pressure  history of blood diseases, like sickle cell anemia or leukemia  history of stomach bleeding  kidney disease  liver disease  stroke  an unusual or allergic reaction to sildenafil, other medicines, foods, dyes, or preservatives  pregnant or trying to get pregnant  breast-feeding How should I use this medicine? Take this medicine by mouth with a glass of water. Follow the directions on the prescription label. The dose is usually taken 1 hour before sexual activity. You should not take the dose more than once per day. Do not take your medicine more often than directed. Talk to your pediatrician regarding the use of this medicine in children. This medicine is not used in children for this condition. Overdosage: If you think you have taken too much of this medicine contact a poison control center or emergency room at once. NOTE: This medicine is only for you. Do not share this medicine with others. What if I miss a dose? This does not apply. Do not take double or extra doses. What may interact with this medicine? Do not take this medicine with any of the following medications:  cisapride  nitrates like amyl nitrite, isosorbide dinitrate, isosorbide mononitrate, nitroglycerin  riociguat This medicine may also interact with the following medications:  antiviral medicines for HIV or AIDS  bosentan  certain medicines for  benign prostatic hyperplasia (BPH)  certain medicines for blood pressure  certain medicines for fungal infections like ketoconazole and itraconazole  cimetidine  erythromycin  rifampin This list may not describe all possible interactions. Give your health care provider a list of all  the medicines, herbs, non-prescription drugs, or dietary supplements you use. Also tell them if you smoke, drink alcohol, or use illegal drugs. Some items may interact with your medicine. What should I watch for while using this medicine? If you notice any changes in your vision while taking this drug, call your doctor or health care professional as soon as possible. Stop using this medicine and call your health care provider right away if you have a loss of sight in one or both eyes. Contact your doctor or health care professional right away if you have an erection that lasts longer than 4 hours or if it becomes painful. This may be a sign of a serious problem and must be treated right away to prevent permanent damage. If you experience symptoms of nausea, dizziness, chest pain or arm pain upon initiation of sexual activity after taking this medicine, you should refrain from further activity and call your doctor or health care professional as soon as possible. Do not drink alcohol to excess (examples, 5 glasses of wine or 5 shots of whiskey) when taking this medicine. When taken in excess, alcohol can increase your chances of getting a headache or getting dizzy, increasing your heart rate or lowering your blood pressure. Using this medicine does not protect you or your partner against HIV infection (the virus that causes AIDS) or other sexually transmitted diseases. What side effects may I notice from receiving this medicine? Side effects that you should report to your doctor or health care professional as soon as possible:  allergic reactions like skin rash, itching or hives, swelling of the face, lips, or tongue  breathing problems  changes in hearing  changes in vision  chest pain  fast, irregular heartbeat  prolonged or painful erection  seizures Side effects that usually do not require medical attention (report to your doctor or health care professional if they continue or are  bothersome):  back pain  dizziness  flushing  headache  indigestion  muscle aches  nausea  stuffy or runny nose This list may not describe all possible side effects. Call your doctor for medical advice about side effects. You may report side effects to FDA at 1-800-FDA-1088. Where should I keep my medicine? Keep out of reach of children. Store at room temperature between 15 and 30 degrees C (59 and 86 degrees F). Throw away any unused medicine after the expiration date. NOTE: This sheet is a summary. It may not cover all possible information. If you have questions about this medicine, talk to your doctor, pharmacist, or health care provider.  2020 Elsevier/Gold Standard (2015-02-19 12:00:25)

## 2018-12-16 ENCOUNTER — Other Ambulatory Visit: Payer: Self-pay | Admitting: Interventional Cardiology

## 2018-12-16 DIAGNOSIS — I2510 Atherosclerotic heart disease of native coronary artery without angina pectoris: Secondary | ICD-10-CM

## 2018-12-16 DIAGNOSIS — E785 Hyperlipidemia, unspecified: Secondary | ICD-10-CM

## 2019-06-25 ENCOUNTER — Other Ambulatory Visit: Payer: Self-pay | Admitting: Interventional Cardiology

## 2019-06-26 ENCOUNTER — Other Ambulatory Visit: Payer: Self-pay | Admitting: Interventional Cardiology

## 2019-11-05 ENCOUNTER — Encounter: Payer: Self-pay | Admitting: Family Medicine

## 2019-11-05 ENCOUNTER — Ambulatory Visit (INDEPENDENT_AMBULATORY_CARE_PROVIDER_SITE_OTHER): Payer: Medicare Other | Admitting: Family Medicine

## 2019-11-05 ENCOUNTER — Other Ambulatory Visit: Payer: Self-pay

## 2019-11-05 VITALS — BP 120/76 | HR 60 | Temp 98.3°F | Ht 69.0 in | Wt 191.0 lb

## 2019-11-05 DIAGNOSIS — E782 Mixed hyperlipidemia: Secondary | ICD-10-CM | POA: Diagnosis not present

## 2019-11-05 DIAGNOSIS — N5201 Erectile dysfunction due to arterial insufficiency: Secondary | ICD-10-CM | POA: Diagnosis not present

## 2019-11-05 DIAGNOSIS — I2510 Atherosclerotic heart disease of native coronary artery without angina pectoris: Secondary | ICD-10-CM | POA: Diagnosis not present

## 2019-11-05 DIAGNOSIS — Z0000 Encounter for general adult medical examination without abnormal findings: Secondary | ICD-10-CM

## 2019-11-05 NOTE — Progress Notes (Signed)
New Patient Office Visit  Subjective:  Patient ID: Timothy Hayden, male    DOB: [birth year]  Age: 64 y.o. MRN: [id number]  CC:  Chief Complaint  Patient presents with  . Establish Care    New patient, no concerns.     HPI Timothy Hayden presents for establishment of care follow-up of elevated cholesterol and history of coronary artery disease.  Patient on statin is 2 stents the second 1 was in his proximal LAD.  He has done well with no chest pain.  He is otherwise in good health.  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  He will see cardiology also in the fall.  Past Medical History:  Diagnosis Date  . CAD (coronary artery disease)    a. PCIx1 in 1999 b. abnormal nuc, then cath 01/08/2015 DES to 75% mid LAD, 95% small distal LCx treated medically  . Hyperlipidemia   . Myocardial infarction (HCC) 1999    Past Surgical History:  Procedure Laterality Date  . CARDIAC CATHETERIZATION N/A 01/08/2015   Procedure: Left Heart Cath and Coronary Angiography;  Surgeon: Corky Crafts, MD;  [facility] INVAS[facility] CV LAB;  Service: Cardiovascular;  Laterality: N/A;  . CARDIAC CATHETERIZATION N/A 01/08/2015   Procedure: Coronary Stent Intervention;  Surgeon: Corky Crafts, MD;  Location: [facility] INVASIVE CV LAB;  Service: Cardiovascular;  Laterality: N/A;  mid lad 2.25x32 synergy  . CARDIAC CATHETERIZATION  1999  . CARDIAC CATHETERIZATION N/A 05/26/2015   Procedure: Left Heart Cath and Coronary Angiography;  Surgeon: Corky Crafts, MD;  Location: [facility] INVASIVE CV LAB;  Service: Cardiovascular;  Laterality: N/A;  . CORONARY ANGIOPLASTY WITH STENT PLACEMENT  1999   "1 stent"  . ENDOSCOPIC STENT PLACEMENT W/ MLB    . KIDNEY SURGERY Right 1971   "had a blood vessel removed off my kidney"    Family History  Problem Relation Age of Onset  . CAD Mother     Social History   Socioeconomic History  . Marital status: Married    Spouse name: Not on file  .  Number of children: Not on file  . Years of education: Not on file  . Highest education level: Not on file  Occupational History  . Not on file  Tobacco Use  . Smoking status: Never Smoker  . Smokeless tobacco: Never Used  Vaping Use  . Vaping Use: Never used  Substance and Sexual Activity  . Alcohol use: No  . Drug use: No  . Sexual activity: Yes  Other Topics Concern  . Not on file  Social History Narrative  . Not on file   Social Determinants of Health   Financial Resource Strain:   . Difficulty of Paying Living Expenses:   Food Insecurity:   . Worried About Programme researcher, broadcasting/film/video in the Last Year:   . Barista in the Last Year:   Transportation Needs:   . Freight forwarder (Medical):   Marland Kitchen Lack of Transportation (Non-Medical):   Physical Activity:   . Days of Exercise per Week:   . Minutes of Exercise per Session:   Stress:   . Feeling of Stress :   Social Connections:   . Frequency of Communication with Friends and Family:   . Frequency of Social Gatherings with Friends and Family:   . Attends Religious Services:   . Active Member of Clubs or Organizations:   . Attends Banker Meetings:   .  Marital Status:   Intimate Partner Violence:   . Fear of Current or Ex-Partner:   . Emotionally Abused:   Marland Kitchen Physically Abused:   . Sexually Abused:     ROS Review of Systems  Constitutional: Negative.   HENT: Negative.   Eyes: Negative for photophobia and visual disturbance.  Respiratory: Negative.   Cardiovascular: Negative.   Gastrointestinal: Negative.   Endocrine: Negative for polyphagia and polyuria.  Genitourinary: Negative.   Musculoskeletal: Negative for gait problem and joint swelling.  Allergic/Immunologic: Negative for immunocompromised state.  Neurological: Negative for light-headedness and numbness.  Hematological: Does not bruise/bleed easily.  Psychiatric/Behavioral: Negative.     Objective:   Today's Vitals: BP 120/76    Pulse 60   Temp 98.3 F (36.8 C) (Tympanic)   Ht 5\' 9"  (1.753 m)   Wt 191 lb (86.6 kg)   SpO2 96%   BMI 28.21 kg/m   Physical Exam Constitutional:      General: He is not in acute distress.    Appearance: Normal appearance. He is normal weight. He is not ill-appearing, toxic-appearing or diaphoretic.  HENT:     Head: Normocephalic and atraumatic.     Right Ear: Tympanic membrane, ear canal and external ear normal. There is no impacted cerumen.     Left Ear: Tympanic membrane and external ear normal. There is no impacted cerumen.     Nose: Nose normal.     Mouth/Throat:     Mouth: Mucous membranes are moist.     Pharynx: Oropharynx is clear. No oropharyngeal exudate or posterior oropharyngeal erythema.  Eyes:     General: No scleral icterus.       Right eye: No discharge.        Left eye: No discharge.     Extraocular Movements: Extraocular movements intact.     Conjunctiva/sclera: Conjunctivae normal.     Pupils: Pupils are equal, round, and reactive to light.  Cardiovascular:     Rate and Rhythm: Normal rate and regular rhythm.  Pulmonary:     Effort: Pulmonary effort is normal.     Breath sounds: Normal breath sounds.  Musculoskeletal:     Cervical back: Normal range of motion. No rigidity or tenderness.     Right lower leg: No edema.     Left lower leg: No edema.  Lymphadenopathy:     Cervical: No cervical adenopathy.  Skin:    General: Skin is warm and dry.  Neurological:     Mental Status: He is alert and oriented to person, place, and time.  Psychiatric:        Mood and Affect: Mood normal.        Behavior: Behavior normal.     Assessment & Plan:   Problem List Items Addressed This Visit      Cardiovascular and Mediastinum   Coronary artery disease involving native coronary artery of native heart without angina pectoris - Primary   Relevant Orders   Comprehensive metabolic panel   CBC   Lipid panel   Erectile dysfunction due to arterial insufficiency       Other   Mixed hyperlipidemia   Relevant Orders   Comprehensive metabolic panel   Lipid panel   Healthcare maintenance   Relevant Orders   PSA   Urinalysis, Routine w reflex microscopic      Outpatient Encounter Medications as of 11/05/2019  Medication Sig  . atorvastatin (LIPITOR) 80 MG tablet TAKE 1 TABLET BY MOUTH EVERYDAY AT BEDTIME  . clopidogrel (  PLAVIX) 75 MG tablet TAKE 1 TABLET BY MOUTH EVERY DAY  . metoprolol tartrate (LOPRESSOR) 50 MG tablet TAKE 1 TABLET BY MOUTH TWICE A DAY  . Multiple Vitamin (MULTI-VITAMINS) TABS Take 1 tablet by mouth daily.   . nitroGLYCERIN (NITROSTAT) 0.4 MG SL tablet Place 1 tablet (0.4 mg total) under the tongue every 5 (five) minutes as needed for chest pain.  . Omega-3 Fatty Acids (FISH OIL) 1000 MG CAPS Take 1,000 mg by mouth daily.   . sildenafil (REVATIO) 20 MG tablet TAKE 3 TO 5 TABLETS BY MOUTH AS NEEDED 1  HOUR  PRIOR  TO  SEXUAL  ACTIVITY   No facility-administered encounter medications on file as of 11/05/2019.    Follow-up: Return in about 6 months (around 05/07/2020), or return for physical exam in 6 months..   Will return fasting for blood work.    Mliss Sax, MD

## 2019-11-06 ENCOUNTER — Other Ambulatory Visit (INDEPENDENT_AMBULATORY_CARE_PROVIDER_SITE_OTHER): Payer: Medicare Other

## 2019-11-06 DIAGNOSIS — I2510 Atherosclerotic heart disease of native coronary artery without angina pectoris: Secondary | ICD-10-CM | POA: Diagnosis not present

## 2019-11-06 DIAGNOSIS — E782 Mixed hyperlipidemia: Secondary | ICD-10-CM

## 2019-11-06 DIAGNOSIS — Z0000 Encounter for general adult medical examination without abnormal findings: Secondary | ICD-10-CM | POA: Diagnosis not present

## 2019-11-06 LAB — PSA: PSA: 2.26 ng/mL (ref 0.10–4.00)

## 2019-11-06 LAB — URINALYSIS, ROUTINE W REFLEX MICROSCOPIC
Bilirubin Urine: NEGATIVE
Hgb urine dipstick: NEGATIVE
Ketones, ur: NEGATIVE
Leukocytes,Ua: NEGATIVE
Nitrite: NEGATIVE
RBC / HPF: NONE SEEN (ref 0–?)
Specific Gravity, Urine: 1.02 (ref 1.000–1.030)
Total Protein, Urine: NEGATIVE
Urine Glucose: NEGATIVE
Urobilinogen, UA: 0.2 (ref 0.0–1.0)
WBC, UA: NONE SEEN (ref 0–?)
pH: 6.5 (ref 5.0–8.0)

## 2019-11-06 LAB — COMPREHENSIVE METABOLIC PANEL
ALT: 20 U/L (ref 0–53)
AST: 15 U/L (ref 0–37)
Albumin: 4.5 g/dL (ref 3.5–5.2)
Alkaline Phosphatase: 83 U/L (ref 39–117)
BUN: 21 mg/dL (ref 6–23)
CO2: 27 mEq/L (ref 19–32)
Calcium: 9.3 mg/dL (ref 8.4–10.5)
Chloride: 103 mEq/L (ref 96–112)
Creatinine, Ser: 0.87 mg/dL (ref 0.40–1.50)
GFR: 88 mL/min (ref >60.00)
Glucose, Bld: 95 mg/dL (ref 70–99)
Potassium: 4.2 mEq/L (ref 3.5–5.1)
Sodium: 137 mEq/L (ref 135–145)
Total Bilirubin: 0.5 mg/dL (ref 0.2–1.2)
Total Protein: 7 g/dL (ref 6.0–8.3)

## 2019-11-06 LAB — CBC
HCT: 45.7 % (ref 39.0–52.0)
Hemoglobin: 15.4 g/dL (ref 13.0–17.0)
MCHC: 33.6 g/dL (ref 30.0–36.0)
MCV: 89.9 fl (ref 78.0–100.0)
Platelets: 189 10*3/uL (ref 150.0–400.0)
RBC: 5.08 Mil/uL (ref 4.22–5.81)
RDW: 14.1 % (ref 11.5–15.5)
WBC: 6.4 10*3/uL (ref 4.0–10.5)

## 2019-11-06 LAB — LIPID PANEL
Cholesterol: 139 mg/dL (ref 0–200)
HDL: 40.1 mg/dL (ref >39.00)
LDL Cholesterol: 69 mg/dL (ref 0–99)
NonHDL: 98.69
Total CHOL/HDL Ratio: 3
Triglycerides: 146 mg/dL (ref 0.0–149.0)
VLDL: 29.2 mg/dL (ref 0.0–40.0)

## 2019-11-19 ENCOUNTER — Other Ambulatory Visit: Payer: Self-pay | Admitting: Interventional Cardiology

## 2019-11-20 ENCOUNTER — Telehealth: Payer: Self-pay | Admitting: Interventional Cardiology

## 2019-11-20 NOTE — Telephone Encounter (Signed)
Pt's pharmacy is requesting a refill on sildenafil. Would Dr. Varanasi like to refill this medication? Please address 

## 2019-11-20 NOTE — Telephone Encounter (Signed)
Refill sent in

## 2019-11-20 NOTE — Telephone Encounter (Signed)
*  STAT* If patient is at the pharmacy, call can be transferred to refill team.   1. Which medications need to be refilled? (please list name of each medication and dose if known) sildenanfil 20 mg  2. Which pharmacy/location (including street and city if local pharmacy) is medication to be sent to? [facility] [phone]  3. Do they need a 30 day or 90 day supply? Not sure

## 2019-12-24 ENCOUNTER — Other Ambulatory Visit: Payer: Self-pay | Admitting: Interventional Cardiology

## 2019-12-24 DIAGNOSIS — I2510 Atherosclerotic heart disease of native coronary artery without angina pectoris: Secondary | ICD-10-CM

## 2019-12-24 DIAGNOSIS — E785 Hyperlipidemia, unspecified: Secondary | ICD-10-CM

## 2020-01-19 NOTE — Progress Notes (Signed)
Cardiology Office Note   Date:  01/21/2020   ID:  Timothy Hayden, DOB 1955-06-16, MRN [id number]  PCP:  Mliss Sax, MD    No chief complaint on file.  CAD  Wt Readings from Last 3 Encounters:  01/21/20 193 lb 6.4 oz (87.7 kg)  11/05/19 191 lb (86.6 kg)  11/14/18 189 lb (85.7 kg)       History of Present Illness: Timothy Hayden is a 64 y.o. male  with a hx of CAD status post stenting in [place], [place] in 1999. 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 This was abnormal and cardiac catheterization was arranged. LHC 01/08/15 demonstrated 95% distal LCx which was very small and treated medically, 75% mid LAD stenosis which was treated with a Synergy DES.   He has done well since the PCI to the LAD. He denies any chest discomfort or shortness of breath. He is exercising regularly without any symptoms. Due to [employer] regulations, he had a repeat cardiac testing. It was unchanged. LAD stent was widely patent.   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  ETT in 8/18 in [place] and it was normal.   He has chronic knee pain that limit his exercise. He tries to avoid walking but will try a stationary bike.   He has retired from [employer].   Since the last visit, he has had some twinges in his chest.  Some related to exertion.  Most feel like twinges under the arm.  He went back to walking on the treadmill and felt that he was more winded than usual as he starts, but he continues on the treadmill. The twinges last for 1-2 minutes.  Nothing prolonged.  Never tried NTG.  Denies : Dizziness. Leg edema. Nitroglycerin use. Orthopnea. Palpitations. Paroxysmal nocturnal dyspnea. Shortness of breath. Syncope.    He has had 3 COVID shots.  Did well with those shots.  Past Medical History:  Diagnosis Date  . CAD (coronary artery disease)    a. PCIx1 in  1999 b. abnormal nuc, then cath 01/08/2015 DES to 75% mid LAD, 95% small distal LCx treated medically  . Hyperlipidemia   . Myocardial infarction (HCC) 1999    Past Surgical History:  Procedure Laterality Date  . CARDIAC CATHETERIZATION N/A 01/08/2015   Procedure: Left Heart Cath and Coronary Angiography;  Surgeon: Corky Crafts, MD;  Location: [facility] INVASIVE CV LAB;  Service: Cardiovascular;  Laterality: N/A;  . CARDIAC CATHETERIZATION N/A 01/08/2015   Procedure: Coronary Stent Intervention;  Surgeon: Corky Crafts, MD;  Location: [facility] INVASIVE CV LAB;  Service: Cardiovascular;  Laterality: N/A;  mid lad 2.25x32 synergy  . CARDIAC CATHETERIZATION  1999  . CARDIAC CATHETERIZATION N/A 05/26/2015   Procedure: Left Heart Cath and Coronary Angiography;  Surgeon: Corky Crafts, MD;  Location: [facility] INVASIVE CV LAB;  Service: Cardiovascular;  Laterality: N/A;  . CORONARY ANGIOPLASTY WITH STENT PLACEMENT  1999   "1 stent"  . ENDOSCOPIC STENT PLACEMENT W/ MLB    . KIDNEY SURGERY Right 1971   "had a blood vessel removed off my kidney"     Current Outpatient Medications  Medication Sig Dispense Refill  . atorvastatin (LIPITOR) 80 MG tablet TAKE 1 TABLET BY MOUTH EVERYDAY AT BEDTIME 90 tablet 0  . clopidogrel (PLAVIX) 75 MG tablet TAKE 1 TABLET BY MOUTH EVERY DAY 90  tablet 3  . metoprolol tartrate (LOPRESSOR) 50 MG tablet TAKE 1 TABLET BY MOUTH TWICE A DAY 180 tablet 3  . Multiple Vitamin (MULTI-VITAMINS) TABS Take 1 tablet by mouth daily.     . nitroGLYCERIN (NITROSTAT) 0.4 MG SL tablet Place 1 tablet (0.4 mg total) under the tongue every 5 (five) minutes as needed for chest pain. 75 tablet 2  . Omega-3 Fatty Acids (FISH OIL) 1000 MG CAPS Take 1,000 mg by mouth daily.     . sildenafil (REVATIO) 20 MG tablet TAKE 3 TO 5 TABLETS BY MOUTH AS NEEDED 1  HOUR  PRIOR  TO  SEXUAL  ACTIVITY 30 tablet 0   No current facility-administered medications for this visit.    Allergies:   Patient has  no known allergies.    Social History:  The patient  reports that he has never smoked. He has never used smokeless tobacco. He reports that he does not drink alcohol and does not use drugs.   Family History:  The patient's family history includes CAD in his mother.    ROS:  Please see the history of present illness.   Otherwise, review of systems are positive for chest twinges.   All other systems are reviewed and negative.    PHYSICAL EXAM: VS:  BP 118/72   Pulse 64   Ht 5\' 9"  (1.753 m)   Wt 193 lb 6.4 oz (87.7 kg)   SpO2 97%   BMI 28.56 kg/m  , BMI Body mass index is 28.56 kg/m. GEN: Well nourished, well developed, in no acute distress  HEENT: normal  Neck: no JVD, carotid bruits, or masses Cardiac: RRR; no murmurs, rubs, or gallops,no edema  Respiratory:  clear to auscultation bilaterally, normal work of breathing GI: soft, nontender, nondistended, + BS; umbilincal hernia present MS: no deformity or atrophy  Skin: warm and dry, no rash Neuro:  Strength and sensation are intact Psych: euthymic mood, full affect   EKG:   The ekg ordered today demonstrates NSR, no ST changes   Recent Labs: 11/06/2019: ALT 20; BUN 21; Creatinine, Ser 0.87; Hemoglobin 15.4; Platelets 189.0; Potassium 4.2; Sodium 137   Lipid Panel    Component Value Date/Time   CHOL 139 11/06/2019 0824   CHOL 121 11/14/2018 1045   TRIG 146.0 11/06/2019 0824   HDL 40.10 11/06/2019 0824   HDL 42 11/14/2018 1045   CHOLHDL 3 11/06/2019 0824   VLDL 29.2 11/06/2019 0824   LDLCALC 69 11/06/2019 0824   LDLCALC 64 11/14/2018 1045     Other studies Reviewed: Additional studies/ records that were reviewed today with results demonstrating: lipids from 8/21 reviewed.   ASSESSMENT AND PLAN:  1. CAD: Atypical chest pain.  Given his sx and history of CAD, will plan nuclear stress test.  Will do this on metoprolol since we are tsting his medical therapy.  He thinks he can still get HR to the  130s. 2. Hyperlipidemia: The current medical regimen is effective;  continue present plan and medications. 3. Old MI: No CHF.  4. Erectile dysfunction:  Using sildenafil.  No NTG use.    Current medicines are reviewed at length with the patient today.  The patient concerns regarding his medicines were addressed.  The following changes have been made:  No change  Labs/ tests ordered today include: nuclear stress No orders of the defined types were placed in this encounter.   Recommend 150 minutes/week of aerobic exercise Low fat, low carb, high fiber diet recommended  Disposition:   FU in for stress   Signed, Lance Muss, MD  01/21/2020 8:20 AM    [facility] [address] [place], [place], [place]  [address] Phone: [phone]; Fax: [phone]

## 2020-01-21 ENCOUNTER — Ambulatory Visit (INDEPENDENT_AMBULATORY_CARE_PROVIDER_SITE_OTHER): Payer: Medicare Other | Admitting: Interventional Cardiology

## 2020-01-21 ENCOUNTER — Other Ambulatory Visit: Payer: Self-pay

## 2020-01-21 ENCOUNTER — Encounter: Payer: Self-pay | Admitting: Interventional Cardiology

## 2020-01-21 VITALS — BP 118/72 | HR 64 | Ht 69.0 in | Wt 193.4 lb

## 2020-01-21 DIAGNOSIS — E782 Mixed hyperlipidemia: Secondary | ICD-10-CM | POA: Diagnosis not present

## 2020-01-21 DIAGNOSIS — I252 Old myocardial infarction: Secondary | ICD-10-CM

## 2020-01-21 DIAGNOSIS — I2510 Atherosclerotic heart disease of native coronary artery without angina pectoris: Secondary | ICD-10-CM | POA: Diagnosis not present

## 2020-01-21 DIAGNOSIS — N529 Male erectile dysfunction, unspecified: Secondary | ICD-10-CM | POA: Diagnosis not present

## 2020-01-21 NOTE — Patient Instructions (Addendum)
Medication Instructions:  Your physician recommends that you continue on your current medications as directed. Please refer to the Current Medication list given to you today.  *If you need a refill on your cardiac medications before your next appointment, please call your pharmacy*   Lab Work: None  If you have labs (blood work) drawn today and your tests are completely normal, you will receive your results only by: Marland Kitchen MyChart Message (if you have MyChart) OR . A paper copy in the mail If you have any lab test that is abnormal or we need to change your treatment, we will call you to review the results.   Testing/Procedures: Your physician has requested that you have en exercise stress myoview. For further information please visit [email or web address] Please follow instruction sheet, as given.  Due to recent COVID-19 restrictions implemented by our local and state authorities and in an effort to keep both patients and staff as safe as possible, our hospital system requires COVID-19 testing prior to certain scheduled hospital procedures.  Please go to [address] [facility]. [place], [place] [address] on ___ at ___  .  This is a drive up testing site.  You will not need to exit your vehicle.  You will not be billed at the time of testing but may receive a bill later depending on your insurance. You must agree to self-quarantine from the time of your testing until the procedure date on ___.  This should included staying home with ONLY the people you live with.  Avoid take-out, grocery store shopping or leaving the house for any non-emergent reason.  Failure to have your COVID-19 test done on the date and time you have been scheduled will result in cancellation of your procedure.  Please call our office at [phone] if you have any questions.   Follow-Up: At [facility], you and your health needs are our priority.  As part of our continuing mission to provide you with exceptional heart care, we have  created designated Provider Care Teams.  These Care Teams include your primary Cardiologist (physician) and Advanced Practice Providers (APPs -  Physician Assistants and Nurse Practitioners) who all work together to provide you with the care you need, when you need it.  We recommend signing up for the patient portal called "MyChart".  Sign up information is provided on this After Visit Summary.  MyChart is used to connect with patients for Virtual Visits (Telemedicine).  Patients are able to view lab/test results, encounter notes, upcoming appointments, etc.  Non-urgent messages can be sent to your provider as well.   To learn more about what you can do with MyChart, go to [email or web address].    Your next appointment:   12 months  The format for your next appointment:   In Person  Provider:   You may see Lance Muss, MD or one of the following Advanced Practice Providers on your designated Care Team:    Ronie Spies, PA-C  Jacolyn Reedy, PA-C    Other Instructions None

## 2020-01-23 ENCOUNTER — Telehealth (HOSPITAL_COMMUNITY): Payer: Self-pay

## 2020-01-23 NOTE — Telephone Encounter (Signed)
Detailed instructions left on the patient's answering machine. Asked to call back with any questions. S.Alejandria Wessells EMTP 

## 2020-01-28 ENCOUNTER — Other Ambulatory Visit (HOSPITAL_COMMUNITY)
Admission: RE | Admit: 2020-01-28 | Discharge: 2020-01-28 | Disposition: A | Discharge status: Home / Self Care | Payer: Medicare Other | Source: Ambulatory Visit | Attending: Interventional Cardiology | Admitting: Interventional Cardiology

## 2020-01-28 DIAGNOSIS — Z20822 Contact with and (suspected) exposure to covid-19: Secondary | ICD-10-CM | POA: Diagnosis not present

## 2020-01-28 DIAGNOSIS — Z01812 Encounter for preprocedural laboratory examination: Secondary | ICD-10-CM | POA: Insufficient documentation

## 2020-01-28 LAB — SARS CORONAVIRUS 2 (TAT 6-24 HRS): SARS Coronavirus 2: NEGATIVE

## 2020-01-31 ENCOUNTER — Ambulatory Visit (HOSPITAL_COMMUNITY): Payer: Medicare Other | Attending: Cardiovascular Disease

## 2020-01-31 ENCOUNTER — Other Ambulatory Visit: Payer: Self-pay

## 2020-01-31 DIAGNOSIS — I252 Old myocardial infarction: Secondary | ICD-10-CM | POA: Diagnosis present

## 2020-01-31 DIAGNOSIS — I2510 Atherosclerotic heart disease of native coronary artery without angina pectoris: Secondary | ICD-10-CM | POA: Diagnosis not present

## 2020-01-31 DIAGNOSIS — E782 Mixed hyperlipidemia: Secondary | ICD-10-CM | POA: Diagnosis present

## 2020-01-31 LAB — MYOCARDIAL PERFUSION IMAGING
Estimated workload: 12.5 METS
Exercise duration (min): 10 min
Exercise duration (sec): 30 s
LV dias vol: 113 mL (ref 62–150)
LV sys vol: 58 mL
MPHR: 155 {beats}/min
Peak HR: 139 {beats}/min
Percent HR: 89 %
RPE: 19
Rest HR: 56 {beats}/min
SDS: 0
SRS: 6
SSS: 7
TID: 0.93

## 2020-01-31 MED ORDER — TECHNETIUM TC 99M TETROFOSMIN IV KIT
10.1000 | PACK | Freq: Once | INTRAVENOUS | Status: AC | PRN
  Administered 2020-01-31: 10.1 via INTRAVENOUS
  Filled 2020-01-31: qty 11

## 2020-01-31 MED ORDER — TECHNETIUM TC 99M TETROFOSMIN IV KIT
31.8000 | PACK | Freq: Once | INTRAVENOUS | Status: AC | PRN
  Administered 2020-01-31: 31.8 via INTRAVENOUS
  Filled 2020-01-31: qty 32

## 2020-02-01 ENCOUNTER — Telehealth: Payer: Self-pay | Admitting: Interventional Cardiology

## 2020-02-01 NOTE — Telephone Encounter (Signed)
Results already givent to patient. See documentation below:   [facility] Triage Reviewed results and recommendations. Understanding verbalized and all questions answered. He knows to contact us if symptoms change or worsen. Tereso Newcomer, PA-C   02/01/2020 11:10 AM

## 2020-02-01 NOTE — Telephone Encounter (Signed)
Timothy Hayden is returning Timothy Hayden's call in regards to his exercise stress test results. Please advise.

## 2020-03-22 ENCOUNTER — Other Ambulatory Visit: Payer: Self-pay | Admitting: Interventional Cardiology

## 2020-03-22 DIAGNOSIS — E785 Hyperlipidemia, unspecified: Secondary | ICD-10-CM

## 2020-03-22 DIAGNOSIS — I2510 Atherosclerotic heart disease of native coronary artery without angina pectoris: Secondary | ICD-10-CM

## 2020-05-08 ENCOUNTER — Ambulatory Visit: Payer: No Typology Code available for payment source | Admitting: Family Medicine

## 2020-06-18 ENCOUNTER — Encounter: Payer: Self-pay | Admitting: Family Medicine

## 2020-06-18 ENCOUNTER — Ambulatory Visit (INDEPENDENT_AMBULATORY_CARE_PROVIDER_SITE_OTHER): Payer: Medicare Other | Admitting: Family Medicine

## 2020-06-18 ENCOUNTER — Other Ambulatory Visit: Payer: Self-pay

## 2020-06-18 VITALS — BP 124/70 | HR 61 | Temp 97.2°F | Ht 69.0 in | Wt 191.4 lb

## 2020-06-18 DIAGNOSIS — Z0000 Encounter for general adult medical examination without abnormal findings: Secondary | ICD-10-CM

## 2020-06-18 DIAGNOSIS — I2510 Atherosclerotic heart disease of native coronary artery without angina pectoris: Secondary | ICD-10-CM | POA: Diagnosis not present

## 2020-06-18 DIAGNOSIS — Z23 Encounter for immunization: Secondary | ICD-10-CM | POA: Diagnosis not present

## 2020-06-18 DIAGNOSIS — K429 Umbilical hernia without obstruction or gangrene: Secondary | ICD-10-CM | POA: Insufficient documentation

## 2020-06-18 DIAGNOSIS — I10 Essential (primary) hypertension: Secondary | ICD-10-CM | POA: Insufficient documentation

## 2020-06-18 DIAGNOSIS — E782 Mixed hyperlipidemia: Secondary | ICD-10-CM

## 2020-06-18 LAB — URINALYSIS, ROUTINE W REFLEX MICROSCOPIC
Bilirubin Urine: NEGATIVE
Hgb urine dipstick: NEGATIVE
Ketones, ur: NEGATIVE
Leukocytes,Ua: NEGATIVE
Nitrite: NEGATIVE
RBC / HPF: NONE SEEN (ref 0–?)
Specific Gravity, Urine: 1.015 (ref 1.000–1.030)
Total Protein, Urine: NEGATIVE
Urine Glucose: NEGATIVE
Urobilinogen, UA: 0.2 (ref 0.0–1.0)
WBC, UA: NONE SEEN (ref 0–?)
pH: 7 (ref 5.0–8.0)

## 2020-06-18 LAB — COMPREHENSIVE METABOLIC PANEL
ALT: 23 U/L (ref 0–53)
AST: 16 U/L (ref 0–37)
Albumin: 4.6 g/dL (ref 3.5–5.2)
Alkaline Phosphatase: 86 U/L (ref 39–117)
BUN: 19 mg/dL (ref 6–23)
CO2: 28 mEq/L (ref 19–32)
Calcium: 9.4 mg/dL (ref 8.4–10.5)
Chloride: 103 mEq/L (ref 96–112)
Creatinine, Ser: 0.78 mg/dL (ref 0.40–1.50)
GFR: 93.37 mL/min (ref >60.00)
Glucose, Bld: 100 mg/dL — ABNORMAL HIGH (ref 70–99)
Potassium: 4 mEq/L (ref 3.5–5.1)
Sodium: 138 mEq/L (ref 135–145)
Total Bilirubin: 0.6 mg/dL (ref 0.2–1.2)
Total Protein: 7.1 g/dL (ref 6.0–8.3)

## 2020-06-18 LAB — LIPID PANEL
Cholesterol: 144 mg/dL (ref 0–200)
HDL: 44.4 mg/dL (ref >39.00)
LDL Cholesterol: 76 mg/dL (ref 0–99)
NonHDL: 99.53
Total CHOL/HDL Ratio: 3
Triglycerides: 120 mg/dL (ref 0.0–149.0)
VLDL: 24 mg/dL (ref 0.0–40.0)

## 2020-06-18 LAB — CBC
HCT: 44.5 % (ref 39.0–52.0)
Hemoglobin: 15.2 g/dL (ref 13.0–17.0)
MCHC: 34.3 g/dL (ref 30.0–36.0)
MCV: 88.6 fl (ref 78.0–100.0)
Platelets: 197 10*3/uL (ref 150.0–400.0)
RBC: 5.02 Mil/uL (ref 4.22–5.81)
RDW: 13.1 % (ref 11.5–15.5)
WBC: 5.1 10*3/uL (ref 4.0–10.5)

## 2020-06-18 LAB — PSA: PSA: 2.09 ng/mL (ref 0.10–4.00)

## 2020-06-18 NOTE — Patient Instructions (Addendum)
Health Maintenance After Age 66 After age 64, you are at a higher risk for certain long-term diseases and infections as well as injuries from falls. Falls are a major cause of broken bones and head injuries in people who are older than age 41. Getting regular preventive care can help to keep you healthy and well. Preventive care includes getting regular testing and making lifestyle changes as recommended by your health care provider. Talk with your health care provider about:  Which screenings and tests you should have. A screening is a test that checks for a disease when you have no symptoms.  A diet and exercise plan that is right for you. What should I know about screenings and tests to prevent falls? Screening and testing are the best ways to find a health problem early. Early diagnosis and treatment give you the best chance of managing medical conditions that are common after age 19. Certain conditions and lifestyle choices may make you more likely to have a fall. Your health care provider may recommend:  Regular vision checks. Poor vision and conditions such as cataracts can make you more likely to have a fall. If you wear glasses, make sure to get your prescription updated if your vision changes.  Medicine review. Work with your health care provider to regularly review all of the medicines you are taking, including over-the-counter medicines. Ask your health care provider about any side effects that may make you more likely to have a fall. Tell your health care provider if any medicines that you take make you feel dizzy or sleepy.  Osteoporosis screening. Osteoporosis is a condition that causes the bones to get weaker. This can make the bones weak and cause them to break more easily.  Blood pressure screening. Blood pressure changes and medicines to control blood pressure can make you feel dizzy.  Strength and balance checks. Your health care provider may recommend certain tests to check your  strength and balance while standing, walking, or changing positions.  Foot health exam. Foot pain and numbness, as well as not wearing proper footwear, can make you more likely to have a fall.  Depression screening. You may be more likely to have a fall if you have a fear of falling, feel emotionally low, or feel unable to do activities that you used to do.  Alcohol use screening. Using too much alcohol can affect your balance and may make you more likely to have a fall. What actions can I take to lower my risk of falls? General instructions  Talk with your health care provider about your risks for falling. Tell your health care provider if: ? You fall. Be sure to tell your health care provider about all falls, even ones that seem minor. ? You feel dizzy, sleepy, or off-balance.  Take over-the-counter and prescription medicines only as told by your health care provider. These include any supplements.  Eat a healthy diet and maintain a healthy weight. A healthy diet includes low-fat dairy products, low-fat (lean) meats, and fiber from whole grains, beans, and lots of fruits and vegetables. Home safety  Remove any tripping hazards, such as rugs, cords, and clutter.  Install safety equipment such as grab bars in bathrooms and safety rails on stairs.  Keep rooms and walkways well-lit. Activity  Follow a regular exercise program to stay fit. This will help you maintain your balance. Ask your health care provider what types of exercise are appropriate for you.  If you need a cane or walker,  use it as recommended by your health care provider.  Wear supportive shoes that have nonskid soles.   Lifestyle  Do not drink alcohol if your health care provider tells you not to drink.  If you drink alcohol, limit how much you have: ? 0-1 drink a day for women. ? 0-2 drinks a day for men.  Be aware of how much alcohol is in your drink. In the [place] one drink equals one typical bottle of beer (12  oz), one-half glass of wine (5 oz), or one shot of hard liquor (1 oz).  Do not use any products that contain nicotine or tobacco, such as cigarettes and e-cigarettes. If you need help quitting, ask your health care provider. Summary  Having a healthy lifestyle and getting preventive care can help to protect your health and wellness after age 95.  Screening and testing are the best way to find a health problem early and help you avoid having a fall. Early diagnosis and treatment give you the best chance for managing medical conditions that are more common for people who are older than age 50.  Falls are a major cause of broken bones and head injuries in people who are older than age 72. Take precautions to prevent a fall at home.  Work with your health care provider to learn what changes you can make to improve your health and wellness and to prevent falls. This information is not intended to replace advice given to you by your health care provider. Make sure you discuss any questions you have with your health care provider. Document Revised: 06/29/2018 Document Reviewed: 01/19/2017 Elsevier Patient Education  [place] 65 Years and Older, Male Preventive care refers to lifestyle choices and visits with your health care provider that can promote health and wellness. This includes:  A yearly physical exam. This is also called an annual wellness visit.  Regular dental and eye exams.  Immunizations.  Screening for certain conditions.  Healthy lifestyle choices, such as: ? Eating a healthy diet. ? Getting regular exercise. ? Not using drugs or products that contain nicotine and tobacco. ? Limiting alcohol use. What can I expect for my preventive care visit? Physical exam Your health care provider will check your:  Height and weight. These may be used to calculate your BMI (body mass index). BMI is a measurement that tells if you are at a healthy  weight.  Heart rate and blood pressure.  Body temperature.  Skin for abnormal spots. Counseling Your health care provider may ask you questions about your:  Past medical problems.  Family's medical history.  Alcohol, tobacco, and drug use.  Emotional well-being.  Home life and relationship well-being.  Sexual activity.  Diet, exercise, and sleep habits.  History of falls.  Memory and ability to understand (cognition).  Work and work Statistician.  Access to firearms. What immunizations do I need? Vaccines are usually given at various ages, according to a schedule. Your health care provider will recommend vaccines for you based on your age, medical history, and lifestyle or other factors, such as travel or where you work.   What tests do I need? Blood tests  Lipid and cholesterol levels. These may be checked every 5 years, or more often depending on your overall health.  Hepatitis C test.  Hepatitis B test. Screening  Lung cancer screening. You may have this screening every year starting at age 25 if you have a 30-pack-year history of smoking and currently  smoke or have quit within the past 15 years.  Colorectal cancer screening. ? All adults should have this screening starting at age 67 and continuing until age 77. ? Your health care provider may recommend screening at age 22 if you are at increased risk. ? You will have tests every 1-10 years, depending on your results and the type of screening test.  Prostate cancer screening. Recommendations will vary depending on your family history and other risks.  Genital exam to check for testicular cancer or hernias.  Diabetes screening. ? This is done by checking your blood sugar (glucose) after you have not eaten for a while (fasting). ? You may have this done every 1-3 years.  Abdominal aortic aneurysm (AAA) screening. You may need this if you are a current or former smoker.  STD (sexually transmitted disease)  testing, if you are at risk. Follow these instructions at home: Eating and drinking  Eat a diet that includes fresh fruits and vegetables, whole grains, lean protein, and low-fat dairy products. Limit your intake of foods with high amounts of sugar, saturated fats, and salt.  Take vitamin and mineral supplements as recommended by your health care provider.  Do not drink alcohol if your health care provider tells you not to drink.  If you drink alcohol: ? Limit how much you have to 0-2 drinks a day. ? Be aware of how much alcohol is in your drink. In the [place] one drink equals one 12 oz bottle of beer (355 mL), one 5 oz glass of wine (148 mL), or one 1 oz glass of hard liquor (44 mL).   Lifestyle  Take daily care of your teeth and gums. Brush your teeth every morning and night with fluoride toothpaste. Floss one time each day.  Stay active. Exercise for at least 30 minutes 5 or more days each week.  Do not use any products that contain nicotine or tobacco, such as cigarettes, e-cigarettes, and chewing tobacco. If you need help quitting, ask your health care provider.  Do not use drugs.  If you are sexually active, practice safe sex. Use a condom or other form of protection to prevent STIs (sexually transmitted infections).  Talk with your health care provider about taking a low-dose aspirin or statin.  Find healthy ways to cope with stress, such as: ? Meditation, yoga, or listening to music. ? Journaling. ? Talking to a trusted person. ? Spending time with friends and family. Safety  Always wear your seat belt while driving or riding in a vehicle.  Do not drive: ? If you have been drinking alcohol. Do not ride with someone who has been drinking. ? When you are tired or distracted. ? While texting.  Wear a helmet and other protective equipment during sports activities.  If you have firearms in your house, make sure you follow all gun safety procedures. What's next?  Visit  your health care provider once a year for an annual wellness visit.  Ask your health care provider how often you should have your eyes and teeth checked.  Stay up to date on all vaccines. This information is not intended to replace advice given to you by your health care provider. Make sure you discuss any questions you have with your health care provider. Document Revised: 12/05/2018 Document Reviewed: 03/02/2018 Elsevier Patient Education  2021 Elsevier Inc.  Umbilical Hernia, Adult  A hernia is a bulge of tissue that pushes through an opening between muscles. An umbilical hernia happens in the  abdomen, near the belly button (umbilicus). The hernia may contain tissues from the small intestine, large intestine, or fatty tissue covering the intestines (omentum). Umbilical hernias in adults tend to get worse over time, and they require surgical treatment. There are several types of umbilical hernias. You may have:  A hernia located just above or below the umbilicus (indirect hernia). This is the most common type of umbilical hernia in adults.  A hernia that forms through an opening formed by the umbilicus (direct hernia).  A hernia that comes and goes (reducible hernia). A reducible hernia may be visible only when you strain, lift something heavy, or cough. This type of hernia can be pushed back into the abdomen (reduced).  A hernia that traps abdominal tissue inside the hernia (incarcerated hernia). This type of hernia cannot be reduced.  A hernia that cuts off blood flow to the tissues inside the hernia (strangulated hernia). The tissues can start to die if this happens. This type of hernia requires emergency treatment. What are the causes? An umbilical hernia happens when tissue inside the abdomen presses on a weak area of the abdominal muscles. What increases the risk? You may have a greater risk of this condition if you:  Are obese.  Have had several pregnancies.  Have a buildup  of fluid inside your abdomen (ascites).  Have had surgery that weakens the abdominal muscles. What are the signs or symptoms? The main symptom of this condition is a painless bulge at or near the belly button. A reducible hernia may be visible only when you strain, lift something heavy, or cough. Other symptoms may include:  Dull pain.  A feeling of pressure. Symptoms of a strangulated hernia may include:  Pain that gets increasingly worse.  Nausea and vomiting.  Pain when pressing on the hernia.  Skin over the hernia becoming red or purple.  Constipation.  Blood in the stool. How is this diagnosed? This condition may be diagnosed based on:  A physical exam. You may be asked to cough or strain while standing. These actions increase the pressure inside your abdomen and force the hernia through the opening in your muscles. Your health care provider may try to reduce the hernia by pressing on it.  Your symptoms and medical history. How is this treated? Surgery is the only treatment for an umbilical hernia. Surgery for a strangulated hernia is done as soon as possible. If you have a small hernia that is not incarcerated, you may need to lose weight before having surgery. Follow these instructions at home:  Lose weight, if told by your health care provider.  Do not try to push the hernia back in.  Watch your hernia for any changes in color or size. Tell your health care provider if any changes occur.  You may need to avoid activities that increase pressure on your hernia.  Do not lift anything that is heavier than 10 lb (4.5 kg) until your health care provider says that this is safe.  Take over-the-counter and prescription medicines only as told by your health care provider.  Keep all follow-up visits as told by your health care provider. This is important. Contact a health care provider if:  Your hernia gets larger.  Your hernia becomes painful. Get help right away  if:  You develop sudden, severe pain near the area of your hernia.  You have pain as well as nausea or vomiting.  You have pain and the skin over your hernia changes color.  You develop a fever. This information is not intended to replace advice given to you by your health care provider. Make sure you discuss any questions you have with your health care provider. Document Revised: 04/20/2017 Document Reviewed: 09/06/2016 Elsevier Patient Education  2021 [employer].

## 2020-06-18 NOTE — Progress Notes (Signed)
Established Patient Office Visit  Subjective:  Patient ID: Timothy Hayden, male    DOB: 1955/03/25  Age: 65 y.o. MRN: [id number]  CC:  Chief Complaint  Patient presents with  . Annual Exam    CPE, no concerns patient fasting for labs.     HPI Timothy Hayden presents for follow-up of hypertension, elevated cholesterol with coronary artery disease and healthcare maintenance.  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  Continues to live with his wife.  He has not had any chest pain or shortness of breath.  Occasional difficulty with urination and nocturia for the most part urine flow is good.  He has not had a pneumonia vaccine yet.  Blood pressure and cholesterol been well controlled on current therapy.  He does not smoke or drink alcohol.  History of umbilical hernia.  It became tender and difficult to reduce about 4 years ago but has not bothered him since.  Past Medical History:  Diagnosis Date  . CAD (coronary artery disease)    a. PCIx1 in 1999 b. abnormal nuc, then cath 01/08/2015 DES to 75% mid LAD, 95% small distal LCx treated medically  . Hyperlipidemia   . Myocardial infarction (HCC) 1999    Past Surgical History:  Procedure Laterality Date  . CARDIAC CATHETERIZATION N/A 01/08/2015   Procedure: Left Heart Cath and Coronary Angiography;  Surgeon: Corky Crafts, MD;  Location: [facility] INVASIVE CV LAB;  Service: Cardiovascular;  Laterality: N/A;  . CARDIAC CATHETERIZATION N/A 01/08/2015   Procedure: Coronary Stent Intervention;  Surgeon: Corky Crafts, MD;  Location: [facility] INVASIVE CV LAB;  Service: Cardiovascular;  Laterality: N/A;  mid lad 2.25x32 synergy  . CARDIAC CATHETERIZATION  1999  . CARDIAC CATHETERIZATION N/A 05/26/2015   Procedure: Left Heart Cath and Coronary Angiography;  Surgeon: Corky Crafts, MD;  Location: [facility] INVASIVE CV LAB;  Service: Cardiovascular;  Laterality: N/A;  . CORONARY ANGIOPLASTY WITH STENT PLACEMENT  1999   "1  stent"  . ENDOSCOPIC STENT PLACEMENT W/ MLB    . KIDNEY SURGERY Right 1971   "had a blood vessel removed off my kidney"    Family History  Problem Relation Age of Onset  . CAD Mother     Social History   Socioeconomic History  . Marital status: Married    Spouse name: Not on file  . Number of children: Not on file  . Years of education: Not on file  . Highest education level: Not on file  Occupational History  . Not on file  Tobacco Use  . Smoking status: Never Smoker  . Smokeless tobacco: Never Used  Vaping Use  . Vaping Use: Never used  Substance and Sexual Activity  . Alcohol use: No  . Drug use: No  . Sexual activity: Yes  Other Topics Concern  . Not on file  Social History Narrative  . Not on file   Social Determinants of Health   Financial Resource Strain: Not on file  Food Insecurity: Not on file  Transportation Needs: Not on file  Physical Activity: Not on file  Stress: Not on file  Social Connections: Not on file  Intimate Partner Violence: Not on file    Outpatient Medications Prior to Visit  Medication Sig Dispense Refill  . atorvastatin (LIPITOR) 80 MG tablet TAKE 1 TABLET BY MOUTH EVERYDAY AT BEDTIME 90 tablet 3  . clopidogrel (PLAVIX) 75 MG tablet TAKE 1 TABLET BY MOUTH EVERY DAY 90 tablet 3  .  metoprolol tartrate (LOPRESSOR) 50 MG tablet TAKE 1 TABLET BY MOUTH TWICE A DAY 180 tablet 3  . Multiple Vitamin (MULTI-VITAMINS) TABS Take 1 tablet by mouth daily.     . nitroGLYCERIN (NITROSTAT) 0.4 MG SL tablet Place 1 tablet (0.4 mg total) under the tongue every 5 (five) minutes as needed for chest pain. 75 tablet 2  . Omega-3 Fatty Acids (FISH OIL) 1000 MG CAPS Take 1,000 mg by mouth daily.     . sildenafil (REVATIO) 20 MG tablet TAKE 3 TO 5 TABLETS BY MOUTH AS NEEDED 1  HOUR  PRIOR  TO  SEXUAL  ACTIVITY 30 tablet 0   No facility-administered medications prior to visit.    No Known Allergies  ROS Review of Systems  Constitutional: Negative.    HENT: Negative.   Eyes: Negative for photophobia and visual disturbance.  Respiratory: Negative.   Cardiovascular: Negative.   Endocrine: Negative for polyphagia and polyuria.  Genitourinary: Positive for difficulty urinating. Negative for frequency and urgency.  Musculoskeletal: Negative for gait problem and joint swelling.  Skin: Negative for pallor and rash.  Neurological: Negative.   Psychiatric/Behavioral: Negative.       Objective:    Physical Exam Vitals and nursing note reviewed.  Constitutional:      General: He is not in acute distress.    Appearance: Normal appearance. He is not ill-appearing, toxic-appearing or diaphoretic.  HENT:     Head: Normocephalic and atraumatic.     Right Ear: Tympanic membrane, ear canal and external ear normal.     Left Ear: Tympanic membrane, ear canal and external ear normal.     Mouth/Throat:     Mouth: Mucous membranes are dry.     Pharynx: Oropharynx is clear. No oropharyngeal exudate or posterior oropharyngeal erythema.  Eyes:     General: No scleral icterus.    Extraocular Movements: Extraocular movements intact.     Conjunctiva/sclera: Conjunctivae normal.     Pupils: Pupils are equal, round, and reactive to light.  Cardiovascular:     Rate and Rhythm: Normal rate and regular rhythm.  Pulmonary:     Effort: Pulmonary effort is normal.     Breath sounds: Normal breath sounds.  Abdominal:     General: Abdomen is flat. Bowel sounds are normal. There is no distension.     Palpations: Abdomen is soft. There is no mass.     Tenderness: There is no abdominal tenderness. There is no guarding or rebound.     Hernia: A hernia is present. There is no hernia in the left inguinal area or right inguinal area.  Genitourinary:    Penis: Normal and circumcised. No hypospadias, erythema, tenderness, discharge, swelling or lesions.      Testes:        Right: Mass, tenderness or swelling not present. Right testis is descended.        Left:  Mass, tenderness or swelling not present. Left testis is descended.     Epididymis:     Right: Not inflamed.     Left: Not inflamed.     Prostate: Enlarged. Not tender and no nodules present.     Rectum: Guaiac result negative. No mass, tenderness, anal fissure, external hemorrhoid or internal hemorrhoid. Normal anal tone.  Musculoskeletal:        General: No swelling or tenderness.     Cervical back: No rigidity or tenderness.  Lymphadenopathy:     Cervical: No cervical adenopathy.     Lower Body: No right  inguinal adenopathy. No left inguinal adenopathy.  Neurological:     Mental Status: He is alert and oriented to person, place, and time.  Psychiatric:        Mood and Affect: Mood normal.        Behavior: Behavior normal.     BP 124/70   Pulse 61   Temp (!) 97.2 F (36.2 C) (Temporal)   Ht 5\' 9"  (1.753 m)   Wt 191 lb 6.4 oz (86.8 kg)   SpO2 99%   BMI 28.26 kg/m  Wt Readings from Last 3 Encounters:  06/18/20 191 lb 6.4 oz (86.8 kg)  01/31/20 193 lb (87.5 kg)  01/21/20 193 lb 6.4 oz (87.7 kg)     Health Maintenance Due  Topic Date Due  . Hepatitis C Screening  Never done  . HIV Screening  Never done  . PNA vac Low Risk Adult (1 of 2 - PCV13) Never done    There are no preventive care reminders to display for this patient.  No results found for: TSH Lab Results  Component Value Date   WBC 6.4 11/06/2019   HGB 15.4 11/06/2019   HCT 45.7 11/06/2019   MCV 89.9 11/06/2019   PLT 189.0 11/06/2019   Lab Results  Component Value Date   NA 137 11/06/2019   K 4.2 11/06/2019   CO2 27 11/06/2019   GLUCOSE 95 11/06/2019   BUN 21 11/06/2019   CREATININE 0.87 11/06/2019   BILITOT 0.5 11/06/2019   ALKPHOS 83 11/06/2019   AST 15 11/06/2019   ALT 20 11/06/2019   PROT 7.0 11/06/2019   ALBUMIN 4.5 11/06/2019   CALCIUM 9.3 11/06/2019   ANIONGAP 9 01/09/2015   GFR 88.00 11/06/2019   Lab Results  Component Value Date   CHOL 139 11/06/2019   Lab Results  Component  Value Date   HDL 40.10 11/06/2019   Lab Results  Component Value Date   LDLCALC 69 11/06/2019   Lab Results  Component Value Date   TRIG 146.0 11/06/2019   Lab Results  Component Value Date   CHOLHDL 3 11/06/2019   No results found for: HGBA1C    Assessment & Plan:   Problem List Items Addressed This Visit      Cardiovascular and Mediastinum   Coronary artery disease involving native coronary artery of native heart without angina pectoris - Primary   Relevant Orders   CBC   Comprehensive metabolic panel   Lipid panel     Other   Mixed hyperlipidemia   Relevant Orders   Comprehensive metabolic panel   Lipid panel   Healthcare maintenance   Relevant Orders   PSA    Other Visit Diagnoses    Essential hypertension       Relevant Orders   CBC   Comprehensive metabolic panel   Urinalysis, Routine w reflex microscopic      No orders of the defined types were placed in this encounter.   Follow-up: No follow-ups on file.  Encouraged patient to continue his healthy lifestyle.  Discussed BPH symptoms and to follow-up with me if they become a problem.  Given information on health maintenance and preventative care as well as umbilical hernia.  11/08/2019, MD

## 2020-06-19 ENCOUNTER — Other Ambulatory Visit: Payer: Self-pay | Admitting: Interventional Cardiology

## 2020-07-16 ENCOUNTER — Other Ambulatory Visit: Payer: Self-pay | Admitting: Interventional Cardiology

## 2020-07-16 MED ORDER — SILDENAFIL CITRATE 20 MG PO TABS
ORAL_TABLET | ORAL | 0 refills | Status: DC
Start: 2020-07-16 — End: 2020-12-09

## 2020-07-16 NOTE — Telephone Encounter (Signed)
Pt is requesting a refill on sildenafil. Would Dr Varanasi like to refill this medication? Please address °

## 2020-07-16 NOTE — Telephone Encounter (Signed)
Refill sent to pharmacy.   

## 2020-07-16 NOTE — Telephone Encounter (Signed)
*  STAT* If patient is at the pharmacy, call can be transferred to refill team.   1. Which medications need to be refilled? (please list name of each medication and dose if known)  Sildenafil  2. Which pharmacy/location (including street and city if local pharmacy) is medication to be sent to? [facility]  3. Do they need a 30 day or 90 day supply? 30 days and refills

## 2020-07-17 ENCOUNTER — Telehealth: Payer: Self-pay

## 2020-07-17 NOTE — Telephone Encounter (Signed)
**Note De-Identified  Obfuscation** Sildenafil PA started through covermymeds. Key: [id number]

## 2020-07-18 NOTE — Telephone Encounter (Signed)
**Note De-Identified  Obfuscation** Letter received from [facility] stating that they denied the pts Sildenafil PA. Reason: Plan exclusion/ DX of ED per Medicare Part D.

## 2020-08-12 ENCOUNTER — Ambulatory Visit (INDEPENDENT_AMBULATORY_CARE_PROVIDER_SITE_OTHER): Payer: Medicare Other

## 2020-08-12 VITALS — Ht 69.0 in | Wt 183.0 lb

## 2020-08-12 DIAGNOSIS — Z0000 Encounter for general adult medical examination without abnormal findings: Secondary | ICD-10-CM | POA: Diagnosis not present

## 2020-08-12 NOTE — Patient Instructions (Signed)
[name] , Thank you for taking time to complete your Medicare Wellness Visit. I appreciate your ongoing commitment to your health goals. Please review the following plan we discussed and let me know if I can assist you in the future.   Screening recommendations/referrals: Colonoscopy: Completed 11/04/2018-Due 11/03/2028 Recommended yearly ophthalmology/optometry visit for glaucoma screening and checkup Recommended yearly dental visit for hygiene and checkup  Vaccinations: Influenza vaccine: Due 11/2020 Pneumococcal vaccine: Up to date Tdap vaccine: Up to date-Due-08/11/2025 Shingles vaccine: Discuss with pharmacy   Covid-19: Up to date  Advanced directives: Please bring a copy for your chart  Conditions/risks identified: See problem list  Next appointment: Follow up in one year for your annual wellness visit.   Preventive Care 40 Years and Older, Male Preventive care refers to lifestyle choices and visits with your health care provider that can promote health and wellness. What does preventive care include?  A yearly physical exam. This is also called an annual well check.  Dental exams once or twice a year.  Routine eye exams. Ask your health care provider how often you should have your eyes checked.  Personal lifestyle choices, including:  Daily care of your teeth and gums.  Regular physical activity.  Eating a healthy diet.  Avoiding tobacco and drug use.  Limiting alcohol use.  Practicing safe sex.  Taking low doses of aspirin every day.  Taking vitamin and mineral supplements as recommended by your health care provider. What happens during an annual well check? The services and screenings done by your health care provider during your annual well check will depend on your age, overall health, lifestyle risk factors, and family history of disease. Counseling  Your health care provider may ask you questions about your:  Alcohol use.  Tobacco use.  Drug  use.  Emotional well-being.  Home and relationship well-being.  Sexual activity.  Eating habits.  History of falls.  Memory and ability to understand (cognition).  Work and work Astronomer. Screening  You may have the following tests or measurements:  Height, weight, and BMI.  Blood pressure.  Lipid and cholesterol levels. These may be checked every 5 years, or more frequently if you are over 9 years old.  Skin check.  Lung cancer screening. You may have this screening every year starting at age 60 if you have a 30-pack-year history of smoking and currently smoke or have quit within the past 15 years.  Fecal occult blood test (FOBT) of the stool. You may have this test every year starting at age 94.  Flexible sigmoidoscopy or colonoscopy. You may have a sigmoidoscopy every 5 years or a colonoscopy every 10 years starting at age 42.  Prostate cancer screening. Recommendations will vary depending on your family history and other risks.  Hepatitis C blood test.  Hepatitis B blood test.  Sexually transmitted disease (STD) testing.  Diabetes screening. This is done by checking your blood sugar (glucose) after you have not eaten for a while (fasting). You may have this done every 1-3 years.  Abdominal aortic aneurysm (AAA) screening. You may need this if you are a current or former smoker.  Osteoporosis. You may be screened starting at age 92 if you are at high risk. Talk with your health care provider about your test results, treatment options, and if necessary, the need for more tests. Vaccines  Your health care provider may recommend certain vaccines, such as:  Influenza vaccine. This is recommended every year.  Tetanus, diphtheria, and acellular pertussis (  Tdap, Td) vaccine. You may need a Td booster every 10 years.  Zoster vaccine. You may need this after age 42.  Pneumococcal 13-valent conjugate (PCV13) vaccine. One dose is recommended after age  57.  Pneumococcal polysaccharide (PPSV23) vaccine. One dose is recommended after age 2. Talk to your health care provider about which screenings and vaccines you need and how often you need them. This information is not intended to replace advice given to you by your health care provider. Make sure you discuss any questions you have with your health care provider. Document Released: 04/04/2015 Document Revised: 11/26/2015 Document Reviewed: 01/07/2015 Elsevier Interactive Patient Education  2017 [place] Prevention in the Home Falls can cause injuries. They can happen to people of all ages. There are many things you can do to make your home safe and to help prevent falls. What can I do on the outside of my home?  Regularly fix the edges of walkways and driveways and fix any cracks.  Remove anything that might make you trip as you walk through a door, such as a raised step or threshold.  Trim any bushes or trees on the path to your home.  Use bright outdoor lighting.  Clear any walking paths of anything that might make someone trip, such as rocks or tools.  Regularly check to see if handrails are loose or broken. Make sure that both sides of any steps have handrails.  Any raised decks and porches should have guardrails on the edges.  Have any leaves, snow, or ice cleared regularly.  Use sand or salt on walking paths during winter.  Clean up any spills in your garage right away. This includes oil or grease spills. What can I do in the bathroom?  Use night lights.  Install grab bars by the toilet and in the tub and shower. Do not use towel bars as grab bars.  Use non-skid mats or decals in the tub or shower.  If you need to sit down in the shower, use a plastic, non-slip stool.  Keep the floor dry. Clean up any water that spills on the floor as soon as it happens.  Remove soap buildup in the tub or shower regularly.  Attach bath mats securely with double-sided  non-slip rug tape.  Do not have throw rugs and other things on the floor that can make you trip. What can I do in the bedroom?  Use night lights.  Make sure that you have a light by your bed that is easy to reach.  Do not use any sheets or blankets that are too big for your bed. They should not hang down onto the floor.  Have a firm chair that has side arms. You can use this for support while you get dressed.  Do not have throw rugs and other things on the floor that can make you trip. What can I do in the kitchen?  Clean up any spills right away.  Avoid walking on wet floors.  Keep items that you use a lot in easy-to-reach places.  If you need to reach something above you, use a strong step stool that has a grab bar.  Keep electrical cords out of the way.  Do not use floor polish or wax that makes floors slippery. If you must use wax, use non-skid floor wax.  Do not have throw rugs and other things on the floor that can make you trip. What can I do with my stairs?  Do  not leave any items on the stairs.  Make sure that there are handrails on both sides of the stairs and use them. Fix handrails that are broken or loose. Make sure that handrails are as long as the stairways.  Check any carpeting to make sure that it is firmly attached to the stairs. Fix any carpet that is loose or worn.  Avoid having throw rugs at the top or bottom of the stairs. If you do have throw rugs, attach them to the floor with carpet tape.  Make sure that you have a light switch at the top of the stairs and the bottom of the stairs. If you do not have them, ask someone to add them for you. What else can I do to help prevent falls?  Wear shoes that:  Do not have high heels.  Have rubber bottoms.  Are comfortable and fit you well.  Are closed at the toe. Do not wear sandals.  If you use a stepladder:  Make sure that it is fully opened. Do not climb a closed stepladder.  Make sure that both  sides of the stepladder are locked into place.  Ask someone to hold it for you, if possible.  Clearly mark and make sure that you can see:  Any grab bars or handrails.  First and last steps.  Where the edge of each step is.  Use tools that help you move around (mobility aids) if they are needed. These include:  Canes.  Walkers.  Scooters.  Crutches.  Turn on the lights when you go into a dark area. Replace any light bulbs as soon as they burn out.  Set up your furniture so you have a clear path. Avoid moving your furniture around.  If any of your floors are uneven, fix them.  If there are any pets around you, be aware of where they are.  Review your medicines with your doctor. Some medicines can make you feel dizzy. This can increase your chance of falling. Ask your doctor what other things that you can do to help prevent falls. This information is not intended to replace advice given to you by your health care provider. Make sure you discuss any questions you have with your health care provider. Document Released: 01/02/2009 Document Revised: 08/14/2015 Document Reviewed: 04/12/2014 Elsevier Interactive Patient Education  2017 [employer].

## 2020-08-12 NOTE — Progress Notes (Signed)
Subjective:   Timothy Hayden is a 65 y.o. male who presents for an Initial Medicare Annual Wellness Visit.  I connected with Aravind today by telephone and verified that I am speaking with the correct person using two identifiers. Location patient: home Location provider: work Persons participating in the virtual visit: patient, Engineer, civil (consulting).    I discussed the limitations, risks, security and privacy concerns of performing an evaluation and management service by telephone and the availability of in person appointments. I also discussed with the patient that there may be a patient responsible charge related to this service. The patient expressed understanding and verbally consented to this telephonic visit.    Interactive audio and video telecommunications were attempted between this provider and patient, however failed, due to patient having technical difficulties OR patient did not have access to video capability.  We continued and completed visit with audio only.  Some vital signs may be absent or patient reported.   Time Spent with patient on telephone encounter: 20 minutes   Review of Systems     Cardiac Risk Factors include: male gender;advanced age (>36men, >47 women);hypertension     Objective:    Today's Vitals   08/12/20 1457  Weight: 183 lb (83 kg)  Height: 5\' 9"  (1.753 m)   Body mass index is 27.02 kg/m.  Advanced Directives 08/12/2020 05/26/2015 01/08/2015  Does Patient Have a Medical Advance Directive? Yes Yes No  Type of 01/10/2015 of [place];Living will Healthcare Power of [place];Living will -  Does patient want to make changes to medical advance directive? - No - Patient declined -  Copy of Healthcare Power of Attorney in Chart? No - copy requested No - copy requested -  Would patient like information on creating a medical advance directive? - - No - patient declined information    Current Medications (verified) Outpatient Encounter  Medications as of 08/12/2020  Medication Sig  . atorvastatin (LIPITOR) 80 MG tablet TAKE 1 TABLET BY MOUTH EVERYDAY AT BEDTIME  . clopidogrel (PLAVIX) 75 MG tablet TAKE 1 TABLET BY MOUTH EVERY DAY  . metoprolol tartrate (LOPRESSOR) 50 MG tablet TAKE 1 TABLET BY MOUTH TWICE A DAY  . Multiple Vitamin (MULTI-VITAMINS) TABS Take 1 tablet by mouth daily.   . nitroGLYCERIN (NITROSTAT) 0.4 MG SL tablet Place 1 tablet (0.4 mg total) under the tongue every 5 (five) minutes as needed for chest pain.  . Omega-3 Fatty Acids (FISH OIL) 1000 MG CAPS Take 1,000 mg by mouth daily.   . sildenafil (REVATIO) 20 MG tablet TAKE 3 TO 5 TABLETS BY MOUTH AS NEEDED 1  HOUR  PRIOR  TO  SEXUAL  ACTIVITY   No facility-administered encounter medications on file as of 08/12/2020.    Allergies (verified) Patient has no known allergies.   History: Past Medical History:  Diagnosis Date  . CAD (coronary artery disease)    a. PCIx1 in 1999 b. abnormal nuc, then cath 01/08/2015 DES to 75% mid LAD, 95% small distal LCx treated medically  . Hyperlipidemia   . Myocardial infarction (HCC) 1999   Past Surgical History:  Procedure Laterality Date  . CARDIAC CATHETERIZATION N/A 01/08/2015   Procedure: Left Heart Cath and Coronary Angiography;  Surgeon: 01/10/2015, MD;  Location: [facility] INVASIVE CV LAB;  Service: Cardiovascular;  Laterality: N/A;  . CARDIAC CATHETERIZATION N/A 01/08/2015   Procedure: Coronary Stent Intervention;  Surgeon: 01/10/2015, MD;  Location: [facility] INVASIVE CV LAB;  Service: Cardiovascular;  Laterality: N/A;  mid lad 2.25x32 synergy  . CARDIAC CATHETERIZATION  1999  . CARDIAC CATHETERIZATION N/A 05/26/2015   Procedure: Left Heart Cath and Coronary Angiography;  Surgeon: Corky CraftsJayadeep S Varanasi, MD;  [facility];  Service: Cardiovascular;  Laterality: N/A;  . CORONARY ANGIOPLASTY WITH STENT PLACEMENT  1999   "1 stent"  . ENDOSCOPIC STENT PLACEMENT W/ MLB    . KIDNEY SURGERY Right  1971   "had a blood vessel removed off my kidney"   Family History  Problem Relation Age of Onset  . CAD Mother    Social History   Socioeconomic History  . Marital status: Married    Spouse name: Not on file  . Number of children: Not on file  . Years of education: Not on file  . Highest education level: Not on file  Occupational History  . Not on file  Tobacco Use  . Smoking status: Never Smoker  . Smokeless tobacco: Never Used  Vaping Use  . Vaping Use: Never used  Substance and Sexual Activity  . Alcohol use: No  . Drug use: No  . Sexual activity: Yes  Other Topics Concern  . Not on file  Social History Narrative  . Not on file   Social Determinants of Health   Financial Resource Strain: Low Risk   . Difficulty of Paying Living Expenses: Not hard at all  Food Insecurity: No Food Insecurity  . Worried About Programme researcher, broadcasting/film/videounning Out of Food in the Last Year: Never true  . Ran Out of Food in the Last Year: Never true  Transportation Needs: No Transportation Needs  . Lack of Transportation (Medical): No  . Lack of Transportation (Non-Medical): No  Physical Activity: Inactive  . Days of Exercise per Week: 0 days  . Minutes of Exercise per Session: 0 min  Stress: No Stress Concern Present  . Feeling of Stress : Not at all  Social Connections: Socially Integrated  . Frequency of Communication with Friends and Family: More than three times a week  . Frequency of Social Gatherings with Friends and Family: More than three times a week  . Attends Religious Services: 1 to 4 times per year  . Active Member of Clubs or Organizations: Yes  . Attends BankerClub or Organization Meetings: 1 to 4 times per year  . Marital Status: Married    Tobacco Counseling Counseling given: Not Answered   Clinical Intake:  Pre-visit preparation completed: Yes  Pain : No/denies pain     Nutritional Status: BMI 25 -29 Overweight Nutritional Risks: None Diabetes: No  How often do you need to have  someone help you when you read instructions, pamphlets, or other written materials from your doctor or pharmacy?: 1 - Never  Diabetic?No  Interpreter Needed?: No  Information entered by :: Thomasenia SalesMartha Alwaleed Obeso LPn   Activities of Daily Living In your present state of health, do you have any difficulty performing the following activities: 08/12/2020  Hearing? N  Vision? N  Difficulty concentrating or making decisions? N  Walking or climbing stairs? N  Dressing or bathing? N  Doing errands, shopping? N  Preparing Food and eating ? N  Using the Toilet? N  In the past six months, have you accidently leaked urine? N  Do you have problems with loss of bowel control? N  Managing your Medications? N  Managing your Finances? N  Housekeeping or managing your Housekeeping? N  Some recent data might be hidden    Patient Care Team: Mliss SaxKremer, William Alfred,  MD as PCP - General (Family Medicine) Corky Crafts, MD as PCP - Cardiology (Cardiology)  Indicate any recent Medical Services you may have received from other than [facility] providers in the past year (date may be approximate).     Assessment:   This is a routine wellness examination for Timothy Hayden.  Hearing/Vision screen  Hearing Screening   125Hz  250Hz  500Hz  1000Hz  2000Hz  3000Hz  4000Hz  6000Hz  8000Hz   Right ear:           Left ear:           Comments: No issues  Vision Screening Comments: Last eye exam-5 years ago  Dietary issues and exercise activities discussed:    Goals Addressed            This Visit's Progress   . Patient Stated       Drink more water & less soda & lose some weight      Depression Screen PHQ 2/9 Scores 08/12/2020 06/18/2020 11/05/2019 11/05/2019  PHQ - 2 Score 0 0 0 0  PHQ- 9 Score - - 0 -    Fall Risk Fall Risk  08/12/2020 06/18/2020 11/05/2019  Falls in the past year? 0 0 0  Number falls in past yr: 0 - -  Injury with Fall? 0 - -  Follow up Falls prevention discussed - -    FALL RISK PREVENTION  PERTAINING TO THE HOME:  Any stairs in or around the home? Yes  If so, are there any without handrails? No  Home free of loose throw rugs in walkways, pet beds, electrical cords, etc? Yes  Adequate lighting in your home to reduce risk of falls? Yes   ASSISTIVE DEVICES UTILIZED TO PREVENT FALLS:  Life alert? No  Use of a cane, walker or w/c? No  Grab bars in the bathroom? No  Shower chair or bench in shower? No  Elevated toilet seat or a handicapped toilet? No   TIMED UP AND GO:  Was the test performed? No . Phone visit   Cognitive Function:Normal cognitive status assessed by  this Nurse Health Advisor. No abnormalities found.          Immunizations Immunization History  Administered Date(s) Administered  . Hepatitis A 01/02/2014, 05/30/2014  . Hepatitis A, Adult 01/02/2014, 05/30/2014  . Influenza-Unspecified 01/17/2017  . Moderna Sars-Covid-2 Vaccination 06/07/2019, 07/05/2019, 12/12/2019  . Pneumococcal Polysaccharide-23 06/18/2020  . Tdap 11/13/2008, 11/13/2008, 08/12/2015, 08/12/2015    TDAP status: Up to date  Flu Vaccine status: Up to date  Pneumococcal vaccine status: Up to date  Covid-19 vaccine status: Completed vaccines  Qualifies for Shingles Vaccine? Yes   Zostavax completed No   Shingrix Completed?: No.    Education has been provided regarding the importance of this vaccine. Patient has been advised to call insurance company to determine out of pocket expense if they have not yet received this vaccine. Advised may also receive vaccine at local pharmacy or Health Dept. Verbalized acceptance and understanding.  Screening Tests Health Maintenance  Topic Date Due  . HIV Screening  Never done  . Hepatitis C Screening  Never done  . INFLUENZA VACCINE  10/20/2020  . PNA vac Low Risk Adult (2 of 2 - PCV13) 06/18/2021  . TETANUS/TDAP  08/11/2025  . COLONOSCOPY (Pts 45-88yrs Insurance coverage will need to be confirmed)  11/03/2028  . COVID-19 Vaccine   Completed  . HPV VACCINES  Aged Out    Health Maintenance  Health Maintenance Due  Topic Date Due  .  HIV Screening  Never done  . Hepatitis C Screening  Never done    Colorectal cancer screening: Type of screening: Colonoscopy. Completed 11/04/2018. Repeat every 10 years  Lung Cancer Screening: (Low Dose CT Chest recommended if Age 70-80 years, 30 pack-year currently smoking OR have quit w/in 15years.) does not qualify.    Additional Screening:  Hepatitis C Screening: does qualify; Discuss with PCP  Vision Screening: Recommended annual ophthalmology exams for early detection of glaucoma and other disorders of the eye. Is the patient up to date with their annual eye exam?  No  Who is the provider or what is the name of the office in which the patient attends annual eye exams? unsure If pt is not established with a provider, would they like to be referred to a provider to establish care? No .   Dental Screening: Recommended annual dental exams for proper oral hygiene  Community Resource Referral / Chronic Care Management: CRR required this visit?  No   CCM required this visit?  No      Plan:     I have personally reviewed and noted the following in the patient's chart:   . Medical and social history . Use of alcohol, tobacco or illicit drugs  . Current medications and supplements including opioid prescriptions. Patient is not currently taking opioid prescriptions. . Functional ability and status . Nutritional status . Physical activity . Advanced directives . List of other physicians . Hospitalizations, surgeries, and ER visits in previous 12 months . Vitals . Screenings to include cognitive, depression, and falls . Referrals and appointments  In addition, I have reviewed and discussed with patient certain preventive protocols, quality metrics, and best practice recommendations. A written personalized care plan for preventive services as well as general preventive  health recommendations were provided to patient.   Due to this being a telephonic visit, the after visit summary with patients personalized plan was offered to patient via mail or my-chart. Patient declined at this time.   Roanna Raider, LPN   [id number]  Nurse Health Advisor  Nurse Notes: none

## 2020-09-04 ENCOUNTER — Ambulatory Visit: Payer: Medicare Other | Attending: Critical Care Medicine

## 2020-09-04 DIAGNOSIS — Z20822 Contact with and (suspected) exposure to covid-19: Secondary | ICD-10-CM

## 2020-09-05 ENCOUNTER — Telehealth: Payer: Self-pay

## 2020-09-05 LAB — SARS-COV-2, NAA 2 DAY TAT

## 2020-09-05 LAB — NOVEL CORONAVIRUS, NAA: SARS-CoV-2, NAA: DETECTED — AB

## 2020-09-05 NOTE — Telephone Encounter (Signed)
Received message that patient was positive for COVID after being tested. Called to check on patient per patient he feels much better from yesterday. Went over symptoms with patient who currently only has a little cough. Patient verbally understood that since he have been vaccinated he will need to isolate himself for 5 day make sure he is hydrated and continue to take OTC meds for symptoms. Patient agrees to call back if symptoms worsen so does not get better.

## 2020-12-08 ENCOUNTER — Other Ambulatory Visit: Payer: Self-pay | Admitting: Interventional Cardiology

## 2020-12-09 NOTE — Telephone Encounter (Signed)
OK to refill

## 2021-03-04 ENCOUNTER — Other Ambulatory Visit: Payer: Self-pay | Admitting: Interventional Cardiology

## 2021-03-04 DIAGNOSIS — I2510 Atherosclerotic heart disease of native coronary artery without angina pectoris: Secondary | ICD-10-CM

## 2021-03-04 DIAGNOSIS — E785 Hyperlipidemia, unspecified: Secondary | ICD-10-CM

## 2021-03-31 NOTE — Progress Notes (Signed)
Cardiology Office Note   Date:  04/01/2021   ID:  Timothy Hayden, DOB Apr 15, 1955, MRN [id number]  PCP:  Mliss Sax, MD    No chief complaint on file.  CAD  Wt Readings from Last 3 Encounters:  04/01/21 193 lb (87.5 kg)  08/12/20 183 lb (83 kg)  06/18/20 191 lb 6.4 oz (86.8 kg)       History of Present Illness: Timothy Hayden is a 66 y.o. male  with a hx of CAD status post stenting in [place], [place] in 1999.  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 This was abnormal and cardiac catheterization was arranged. LHC 01/08/15 demonstrated 95% distal LCx which was very small and treated medically, 75% mid LAD stenosis which was treated with a Synergy DES.    He has done well since the PCI to the LAD. He denies any chest discomfort or shortness of breath. He is exercising regularly without any symptoms. Due to [employer] regulations, he had a repeat cardiac testing.  It was unchanged.  LAD stent was widely patent.     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     ETT in 8/18 in [place] and it was normal.    He has chronic knee pain that limit his exercise.  He tries to avoid walking but will try a stationary bike.     He has retired from [employer].   11/21 nuclear stress test showed: "Nuclear stress EF: 49%. Blood pressure demonstrated a normal response to exercise. Upsloping ST segment depression ST segment depression was noted during stress in the V4, V5, V6, V3, II, III and aVF leads, and returning to baseline after 5-9 minutes of recovery. No T wave inversion was noted during stress. Defect 1: There is a small defect of severe severity present in the mid anterior, mid anterolateral and apical lateral location. Findings consistent with prior myocardial infarction with peri-infarct ischemia. This is an intermediate risk study. The left ventricular  ejection fraction is mildly decreased (45-54%)." Went to [employer] in 02/2021.  He did well walking.  Denies : Chest pain. Dizziness. Leg edema. Nitroglycerin use. Orthopnea. Palpitations. Paroxysmal nocturnal dyspnea. Shortness of breath. Syncope.   He has not noticed any slow heart rates at home.  When he tries 100 mg sildenafil, he feels flushed.  He typically sticks to 80 mg dose.  He feels that his umbilical hernia, which has been going on for 10 years, is getting larger.  Past Medical History:  Diagnosis Date   CAD (coronary artery disease)    a. PCIx1 in 1999 b. abnormal nuc, then cath 01/08/2015 DES to 75% mid LAD, 95% small distal LCx treated medically   Hyperlipidemia    Myocardial infarction [facility]) 1999    Past Surgical History:  Procedure Laterality Date   CARDIAC CATHETERIZATION N/A 01/08/2015   Procedure: Left Heart Cath and Coronary Angiography;  Surgeon: Corky Crafts, MD;  Location: [facility] INVASIVE CV LAB;  Service: Cardiovascular;  Laterality: N/A;   CARDIAC CATHETERIZATION N/A 01/08/2015   Procedure: Coronary Stent Intervention;  Surgeon: Corky Crafts, MD;  Location: [facility] INVASIVE CV LAB;  Service: Cardiovascular;  Laterality: N/A;  mid lad 2.25x32 synergy   CARDIAC CATHETERIZATION  1999   CARDIAC CATHETERIZATION N/A 05/26/2015   Procedure: Left Heart Cath and Coronary Angiography;  Surgeon: Donnie Coffin  Eldridge Dace, MD;  Location: [facility] INVASIVE CV LAB;  Service: Cardiovascular;  Laterality: N/A;   CORONARY ANGIOPLASTY WITH STENT PLACEMENT  1999   "1 stent"   ENDOSCOPIC STENT PLACEMENT W/ MLB     KIDNEY SURGERY Right 1971   "had a blood vessel removed off my kidney"     Current Outpatient Medications  Medication Sig Dispense Refill   Multiple Vitamin (MULTI-VITAMINS) TABS Take 1 tablet by mouth daily.      nitroGLYCERIN (NITROSTAT) 0.4 MG SL tablet Place 1 tablet (0.4 mg total) under the tongue every 5 (five) minutes as needed for chest pain. 75 tablet 2    Omega-3 Fatty Acids (FISH OIL) 1000 MG CAPS Take 1,000 mg by mouth daily.      sildenafil (REVATIO) 20 MG tablet TAKE 3-5 TABLETS BY MOUTH AS NEEDED 1 HOUR PRIOR TO SEXUAL ACTIVITY 30 tablet 0   atorvastatin (LIPITOR) 80 MG tablet Take 1 tablet (80 mg total) by mouth at bedtime. 90 tablet 3   clopidogrel (PLAVIX) 75 MG tablet Take 1 tablet (75 mg total) by mouth daily. 90 tablet 3   metoprolol tartrate (LOPRESSOR) 25 MG tablet Take 1 tablet (25 mg total) by mouth 2 (two) times daily. 180 tablet 3   No current facility-administered medications for this visit.    Allergies:   Patient has no known allergies.    Social History:  The patient  reports that he has never smoked. He has never used smokeless tobacco. He reports that he does not drink alcohol and does not use drugs.   Family History:  The patient's family history includes CAD in his mother.    ROS:  Please see the history of present illness.   Otherwise, review of systems are positive for increased hernia size, mild weight gain.   All other systems are reviewed and negative.    PHYSICAL EXAM: VS:  BP 134/78    Pulse (!) 48    Ht 5\' 9"  (1.753 m)    Wt 193 lb (87.5 kg)    SpO2 98%    BMI 28.50 kg/m  , BMI Body mass index is 28.5 kg/m. GEN: Well nourished, well developed, in no acute distress HEENT: normal Neck: no JVD, carotid bruits, or masses Cardiac: RRR; no murmurs, rubs, or gallops,no edema  Respiratory:  clear to auscultation bilaterally, normal work of breathing GI: soft, nontender, nondistended, + BS, small umbilical hernia MS: no deformity or atrophy Skin: warm and dry, no rash Neuro:  Strength and sensation are intact Psych: euthymic mood, full affect   EKG:   The ekg ordered today demonstrates sinus bradycardia, no ST changes   Recent Labs: 06/18/2020: ALT 23; BUN 19; Creatinine, Ser 0.78; Hemoglobin 15.2; Platelets 197.0; Potassium 4.0; Sodium 138   Lipid Panel    Component Value Date/Time   CHOL 144  06/18/2020 1104   CHOL 121 11/14/2018 1045   TRIG 120.0 06/18/2020 1104   HDL 44.40 06/18/2020 1104   HDL 42 11/14/2018 1045   CHOLHDL 3 06/18/2020 1104   VLDL 24.0 06/18/2020 1104   LDLCALC 76 06/18/2020 1104   LDLCALC 64 11/14/2018 1045     Other studies Reviewed: Additional studies/ records that were reviewed today with results demonstrating: Labs reviewed, LDL 76 in March 2022.   ASSESSMENT AND PLAN:  CAD/Old MI: No angina on medical therapy.  Status post multivessel PCI.  No symptoms like prior MI.  Whole food, plant-based diet.  Avoid processed foods.  Weight has increased slightly since  her last visit.  He is working on this. Hyperlipidemia: Continue high potency statin.  We will recheck lipids today. Old MI: No CHF symptoms. Erectile dysfunction: Has used sildenafil.  Typically feels best with a dose up to 80 mg. Umbilical hernia: Not acutely painful at this time.  He does feel it is enlarging.  Will refer to surgery for further evaluation. Needs flu shot.  We will give high-dose flu shot today in the office.   Current medicines are reviewed at length with the patient today.  The patient concerns regarding his medicines were addressed.  The following changes have been made:  No change  Labs/ tests ordered today include:   Orders Placed This Encounter  Procedures   CBC   Comprehensive metabolic panel   Lipid panel   Ambulatory referral to General Surgery   EKG 12-Lead    Recommend 150 minutes/week of aerobic exercise Low fat, low carb, high fiber diet recommended  Disposition:   FU in 1 year   Signed, Lance MussJayadeep Jameil Whitmoyer, MD  04/01/2021 8:49 AM    [facility] [address], [place], [place]  [address] Phone: [phone]) [phone]; Fax: [phone](336) [phone]

## 2021-04-01 ENCOUNTER — Other Ambulatory Visit: Payer: Self-pay

## 2021-04-01 ENCOUNTER — Encounter: Payer: Self-pay | Admitting: Interventional Cardiology

## 2021-04-01 ENCOUNTER — Ambulatory Visit (INDEPENDENT_AMBULATORY_CARE_PROVIDER_SITE_OTHER): Payer: Medicare Other | Admitting: Interventional Cardiology

## 2021-04-01 VITALS — BP 134/78 | HR 48 | Ht 69.0 in | Wt 193.0 lb

## 2021-04-01 DIAGNOSIS — I2510 Atherosclerotic heart disease of native coronary artery without angina pectoris: Secondary | ICD-10-CM | POA: Diagnosis not present

## 2021-04-01 DIAGNOSIS — Z23 Encounter for immunization: Secondary | ICD-10-CM | POA: Diagnosis not present

## 2021-04-01 DIAGNOSIS — E782 Mixed hyperlipidemia: Secondary | ICD-10-CM

## 2021-04-01 DIAGNOSIS — N529 Male erectile dysfunction, unspecified: Secondary | ICD-10-CM | POA: Diagnosis not present

## 2021-04-01 DIAGNOSIS — K429 Umbilical hernia without obstruction or gangrene: Secondary | ICD-10-CM

## 2021-04-01 DIAGNOSIS — I252 Old myocardial infarction: Secondary | ICD-10-CM | POA: Diagnosis not present

## 2021-04-01 LAB — CBC
Hematocrit: 46.2 % (ref 37.5–51.0)
Hemoglobin: 15.6 g/dL (ref 13.0–17.7)
MCH: 29.1 pg (ref 26.6–33.0)
MCHC: 33.8 g/dL (ref 31.5–35.7)
MCV: 86 fL (ref 79–97)
Platelets: 225 10*3/uL (ref 150–450)
RBC: 5.36 x10E6/uL (ref 4.14–5.80)
RDW: 12.7 % (ref 11.6–15.4)
WBC: 5.1 10*3/uL (ref 3.4–10.8)

## 2021-04-01 LAB — COMPREHENSIVE METABOLIC PANEL
ALT: 31 IU/L (ref 0–44)
AST: 23 IU/L (ref 0–40)
Albumin/Globulin Ratio: 2 (ref 1.2–2.2)
Albumin: 4.5 g/dL (ref 3.8–4.8)
Alkaline Phosphatase: 105 IU/L (ref 44–121)
BUN/Creatinine Ratio: 14 (ref 10–24)
BUN: 14 mg/dL (ref 8–27)
Bilirubin Total: 0.5 mg/dL (ref 0.0–1.2)
CO2: 25 mmol/L (ref 20–29)
Calcium: 9.3 mg/dL (ref 8.6–10.2)
Chloride: 101 mmol/L (ref 96–106)
Creatinine, Ser: 0.97 mg/dL (ref 0.76–1.27)
Globulin, Total: 2.3 g/dL (ref 1.5–4.5)
Glucose: 93 mg/dL (ref 70–99)
Potassium: 4.5 mmol/L (ref 3.5–5.2)
Sodium: 137 mmol/L (ref 134–144)
Total Protein: 6.8 g/dL (ref 6.0–8.5)
eGFR: 86 mL/min/{1.73_m2} (ref >59)

## 2021-04-01 LAB — LIPID PANEL
Chol/HDL Ratio: 3 ratio (ref 0.0–5.0)
Cholesterol, Total: 134 mg/dL (ref 100–199)
HDL: 45 mg/dL (ref >39)
LDL Chol Calc (NIH): 74 mg/dL (ref 0–99)
Triglycerides: 75 mg/dL (ref 0–149)
VLDL Cholesterol Cal: 15 mg/dL (ref 5–40)

## 2021-04-01 MED ORDER — CLOPIDOGREL BISULFATE 75 MG PO TABS: 75.0000 mg | ORAL_TABLET | Freq: Every day | ORAL | 3 refills | Status: DC

## 2021-04-01 MED ORDER — ATORVASTATIN CALCIUM 80 MG PO TABS: 80.0000 mg | ORAL_TABLET | Freq: Every day | ORAL | 3 refills | Status: DC

## 2021-04-01 MED ORDER — METOPROLOL TARTRATE 25 MG PO TABS: 25.0000 mg | ORAL_TABLET | Freq: Two times a day (BID) | ORAL | 3 refills | Status: DC

## 2021-04-01 NOTE — Patient Instructions (Addendum)
Medication Instructions:  Your physician has recommended you make the following change in your medication:   DECREASE: metoprolol to 25 mg tablet by mouth twice a day   *If you need a refill on your cardiac medications before your next appointment, please call your pharmacy*   Lab Work: TODAY: CBC, CMET, LIPIDS  If you have labs (blood work) drawn today and your tests are completely normal, you will receive your results only by: MyChart Message (if you have MyChart) OR A paper copy in the mail If you have any lab test that is abnormal or we need to change your treatment, we will call you to review the results.   Testing/Procedures: None  Follow-Up: At [facility], you and your health needs are our priority.  As part of our continuing mission to provide you with exceptional heart care, we have created designated Provider Care Teams.  These Care Teams include your primary Cardiologist (physician) and Advanced Practice Providers (APPs -  Physician Assistants and Nurse Practitioners) who all work together to provide you with the care you need, when you need it.  We recommend signing up for the patient portal called "MyChart".  Sign up information is provided on this After Visit Summary.  MyChart is used to connect with patients for Virtual Visits (Telemedicine).  Patients are able to view lab/test results, encounter notes, upcoming appointments, etc.  Non-urgent messages can be sent to your provider as well.   To learn more about what you can do with MyChart, go to [email or web address].    Your next appointment:   12 month(s)  The format for your next appointment:   In Person  Provider:   Lance Muss, MD {   Other Instructions You have been referred to General Surgery for your umbilical hernia.

## 2021-04-04 ENCOUNTER — Other Ambulatory Visit: Payer: Self-pay | Admitting: Interventional Cardiology

## 2021-04-28 ENCOUNTER — Encounter: Payer: Self-pay | Admitting: Interventional Cardiology

## 2021-05-19 ENCOUNTER — Telehealth: Payer: Self-pay | Admitting: *Deleted

## 2021-05-19 NOTE — Telephone Encounter (Signed)
I s/w triage nurse in regard to notes that were sent to our office from Dr. Bedelia Person. Per the ov note Dr. Bedelia Person d/w the pt about surgery though not to be done until sometime after summer pt pt request. I did inform the triage nurse that we will need a clearance request sooner to when surgery is being planned. Cardiologist will not clear for more than 2 months out generally. Triage nurse gave verbal understanding and will let the surgery scheduler for Dr. Bedelia Person to know to send out a clearance request to our office fax # [phone] attn: Pre op team sometime mid to late summer. I will fax these notes over to Dr. Marvetta Gibbons office as an Lorain Childes.

## 2021-05-22 ENCOUNTER — Other Ambulatory Visit: Payer: Self-pay | Admitting: Surgery

## 2021-05-22 DIAGNOSIS — K429 Umbilical hernia without obstruction or gangrene: Secondary | ICD-10-CM

## 2021-06-09 ENCOUNTER — Ambulatory Visit: Payer: Medicare Other | Admitting: Physician Assistant

## 2021-08-13 ENCOUNTER — Telehealth: Payer: Self-pay | Admitting: Family Medicine

## 2021-08-13 NOTE — Telephone Encounter (Signed)
Left message for patient to call back and schedule Medicare Annual Wellness Visit (AWV).   Please offer to do virtually or by telephone.  Left office number and my jabber #[phone].  Last AWV:08/12/2020  Please schedule at anytime with Nurse Health Advisor.   

## 2021-08-20 ENCOUNTER — Telehealth: Payer: Self-pay | Admitting: *Deleted

## 2021-08-20 NOTE — Telephone Encounter (Signed)
   Pre-operative Risk Assessment    Patient Name: Timothy Hayden  DOB: 07-Aug-1955 MRN: [id number]      Request for Surgical Clearance    Procedure:   HERNIA SURGERY  Date of Surgery:  Clearance TBD                               Surgeon:  DR. Reather Laurence Surgeon's Group or Practice Name:  [facility] Phone number:  [phone] Fax number:  [phone] ATTN: Emeline Gins, CMA   Type of Clearance Requested:   - Medical  - Pharmacy:  Hold Clopidogrel (Plavix)     Type of Anesthesia:  General    Additional requests/questions:    [name]   08/20/2021, 9:48 AM

## 2021-08-21 ENCOUNTER — Telehealth: Payer: Self-pay

## 2021-08-21 NOTE — Telephone Encounter (Signed)
    Name: Timothy Hayden  DOB: Jun 07, 1955  MRN: [id number]  Primary Cardiologist: Larae Grooms, MD   Preoperative team, please contact this patient and set up a phone call appointment for further preoperative risk assessment. Please obtain consent and complete medication review. Thank you for your help.  I confirm that guidance regarding antiplatelet and oral anticoagulation therapy has been completed and, if necessary, noted below.  Patient was granted 5-day hold time for Plavix for previous procedure by Dr. [name].  Mable Fill, Marissa Nestle, NP 08/21/2021, 12:39 PM [place] [address] [place], [place] [address]

## 2021-08-21 NOTE — Telephone Encounter (Signed)
  Patient Consent for Virtual Visit         Timothy Hayden has provided verbal consent on 08/21/2021 for a virtual visit (video or telephone).   CONSENT FOR VIRTUAL VISIT FOR:  Timothy Hayden  By participating in this virtual visit I agree to the following:  I hereby voluntarily request, consent and authorize [facility] and its employed or contracted physicians, physician assistants, nurse practitioners or other licensed health care professionals (the Practitioner), to provide me with telemedicine health care services (the "Services") as deemed necessary by the treating Practitioner. I acknowledge and consent to receive the Services by the Practitioner via telemedicine. I understand that the telemedicine visit will involve communicating with the Practitioner through live audiovisual communication technology and the disclosure of certain medical information by electronic transmission. I acknowledge that I have been given the opportunity to request an in-person assessment or other available alternative prior to the telemedicine visit and am voluntarily participating in the telemedicine visit.  I understand that I have the right to withhold or withdraw my consent to the use of telemedicine in the course of my care at any time, without affecting my right to future care or treatment, and that the Practitioner or I may terminate the telemedicine visit at any time. I understand that I have the right to inspect all information obtained and/or recorded in the course of the telemedicine visit and may receive copies of available information for a reasonable fee.  I understand that some of the potential risks of receiving the Services via telemedicine include:  Delay or interruption in medical evaluation due to technological equipment failure or disruption; Information transmitted may not be sufficient (e.g. poor resolution of images) to allow for appropriate medical decision making by the Practitioner;  and/or  In rare instances, security protocols could fail, causing a breach of personal health information.  Furthermore, I acknowledge that it is my responsibility to provide information about my medical history, conditions and care that is complete and accurate to the best of my ability. I acknowledge that Practitioner's advice, recommendations, and/or decision may be based on factors not within their control, such as incomplete or inaccurate data provided by me or distortions of diagnostic images or specimens that may result from electronic transmissions. I understand that the practice of medicine is not an exact science and that Practitioner makes no warranties or guarantees regarding treatment outcomes. I acknowledge that a copy of this consent can be made available to me via my patient portal [facility] MyChart), or I can request a printed copy by calling the office of [facility].    I understand that my insurance will be billed for this visit.   I have read or had this consent read to me. I understand the contents of this consent, which adequately explains the benefits and risks of the Services being provided via telemedicine.  I have been provided ample opportunity to ask questions regarding this consent and the Services and have had my questions answered to my satisfaction. I give my informed consent for the services to be provided through the use of telemedicine in my medical care

## 2021-08-21 NOTE — Telephone Encounter (Signed)
Patient contacted, consent and medication reviewed.  Patient voiced understanding.

## 2021-08-27 ENCOUNTER — Ambulatory Visit (INDEPENDENT_AMBULATORY_CARE_PROVIDER_SITE_OTHER): Payer: Medicare Other | Admitting: Nurse Practitioner

## 2021-08-27 DIAGNOSIS — Z01810 Encounter for preprocedural cardiovascular examination: Secondary | ICD-10-CM | POA: Diagnosis not present

## 2021-08-27 NOTE — Progress Notes (Signed)
Virtual Visit via Telephone Note   Because of Timothy Hayden's co-morbid illnesses, he is at least at moderate risk for complications without adequate follow up.  This format is felt to be most appropriate for this patient at this time.  The patient did not have access to video technology/had technical difficulties with video requiring transitioning to audio format only (telephone).  All issues noted in this document were discussed and addressed.  No physical exam could be performed with this format.  Please refer to the patient's chart for his consent to telehealth for [facility].  Evaluation Performed:  Preoperative cardiovascular risk assessment _____________   Date:  08/27/2021   Patient ID:  Timothy Hayden, DOB 1956/02/13, MRN [id number] Patient Location:  Home Provider location:   Office  Primary Care Provider:  Mliss Sax, MD Primary Cardiologist:  Lance Muss, MD  Chief Complaint / Patient Profile   66 y.o. y/o male with a h/o CAD s/p remote PCI in 1999 with subsequent DES-LAD and LCx (2016 ad 2017), hyperlipidemia, ED, and umbilical hernia who is pending hernia surgery with Dr. Kris Mouton of [facility] and presents today for telephonic preoperative cardiovascular risk assessment.  Past Medical History    Past Medical History:  Diagnosis Date   CAD (coronary artery disease)    a. PCIx1 in 1999 b. abnormal nuc, then cath 01/08/2015 DES to 75% mid LAD, 95% small distal LCx treated medically   Hyperlipidemia    Myocardial infarction [facility]) 1999   Past Surgical History:  Procedure Laterality Date   CARDIAC CATHETERIZATION N/A 01/08/2015   Procedure: Left Heart Cath and Coronary Angiography;  Surgeon: Corky Crafts, MD;  Location: [facility] INVASIVE CV LAB;  Service: Cardiovascular;  Laterality: N/A;   CARDIAC CATHETERIZATION N/A 01/08/2015   Procedure: Coronary Stent Intervention;  Surgeon: Corky Crafts, MD;   Location[facility] INVASIVE CV LAB;  Service: Cardiovascular;  Laterality: N/A;  mid lad 2.25x32 synergy   CARDIAC CATHETERIZATION  1999   CARDIAC CATHETERIZATION N/A 05/26/2015   Procedure: Left Heart Cath and Coronary Angiography;  Surgeon: Corky Crafts, MD;  Location: [facility] INVASIVE CV LAB;  Service: Cardiovascular;  Laterality: N/A;   CORONARY ANGIOPLASTY WITH STENT PLACEMENT  1999   "1 stent"   ENDOSCOPIC STENT PLACEMENT W/ MLB     KIDNEY SURGERY Right 1971   "had a blood vessel removed off my kidney"    Allergies  No Known Allergies  History of Present Illness    Timothy Hayden is a 66 y.o. male who presents via audio/video conferencing for a telehealth visit today.  Pt was last seen in cardiology clinic on 1/11//2023 by Dr. Eldridge Dace. At that time Timothy Hayden was doing well a cardiac standpoint. He denied symptoms concerning for angina. He was referred to surgery for further evaluation of umbilical hernia. The patient is now pending procedure as outlined above.Since his last visit, he has been stable from a cardiac standpoint. He notes rare mild chest tightness, however, this has been stable over the past several years. He denies chest pain, palpitations, dyspnea, pnd, orthopnea, n, v, dizziness, syncope, edema, weight gain, or early satiety. All other systems reviewed and are otherwise negative except as noted above.  He remains very active, he works [employer], and does lots of heavy lifting. Overall, he reports feeling well and denies any new symptoms or concerns today.   Home Medications    Prior to Admission medications   Medication  Sig Start Date End Date Taking? Authorizing Provider  atorvastatin (LIPITOR) 80 MG tablet Take 1 tablet (80 mg total) by mouth at bedtime. 04/01/21   Jettie Booze, MD  clopidogrel (PLAVIX) 75 MG tablet Take 1 tablet (75 mg total) by mouth daily. 04/01/21   Jettie Booze, MD  metoprolol tartrate (LOPRESSOR) 25 MG tablet  Take 1 tablet (25 mg total) by mouth 2 (two) times daily. 04/01/21   Jettie Booze, MD  Multiple Vitamin (MULTI-VITAMINS) TABS Take 1 tablet by mouth daily.     [provider]  nitroGLYCERIN (NITROSTAT) 0.4 MG SL tablet Place 1 tablet (0.4 mg total) under the tongue every 5 (five) minutes as needed for chest pain. 11/14/18   Jettie Booze, MD  Omega-3 Fatty Acids (FISH OIL) 1000 MG CAPS Take 1,000 mg by mouth daily.     [provider]  sildenafil (REVATIO) 20 MG tablet TAKE 3-5 TABLETS BY MOUTH AS NEEDED 1 HOUR PRIOR TO SEXUAL ACTIVITY 12/09/20   Jettie Booze, MD    Physical Exam    Vital Signs:  Timothy Hayden does not have vital signs available for review today.  Given telephonic nature of communication, physical exam is limited. AAOx3. NAD. Normal affect.  Speech and respirations are unlabored.  Accessory Clinical Findings    None  Assessment & Plan    1.  Preoperative Cardiovascular Risk Assessment: According to the Revised Cardiac Risk Index (RCRI), his Perioperative Risk of Major Cardiac Event is (%): 0.9. His Functional Capacity in METs is: 8.33 according to the Duke Activity Status Index (DASI).Therefore, based on ACC/AHA guidelines, patient would be at acceptable risk for the planned procedure without further cardiovascular testing. I advised the patient that if he develops any new symptoms prior to his surgery to please notify our office.  Per Dr. [name], primary cardiologist, he may hold his Plavix for 5 days prior to procedure. Please resume Plavix as soon as possible postprocedure, at the discretion of the surgeon.  A copy of this note will be routed to requesting surgeon.  Time:   Today, I have spent 8 minutes with the patient with telehealth technology discussing medical history, symptoms, and management plan.     Lenna Sciara, NP  08/27/2021, 10:14 AM

## 2021-09-02 ENCOUNTER — Ambulatory Visit (INDEPENDENT_AMBULATORY_CARE_PROVIDER_SITE_OTHER): Payer: Medicare Other

## 2021-09-02 DIAGNOSIS — Z0000 Encounter for general adult medical examination without abnormal findings: Secondary | ICD-10-CM | POA: Diagnosis not present

## 2021-09-02 DIAGNOSIS — Z0100 Encounter for examination of eyes and vision without abnormal findings: Secondary | ICD-10-CM | POA: Diagnosis not present

## 2021-09-02 NOTE — Progress Notes (Signed)
Subjective:   Timothy Hayden is a 66 y.o. male who presents for an Subsequent  Medicare Annual Wellness Visit.   I connected with Timothy Hayden today by telephone and verified that I am speaking with the correct person using two identifiers. Location patient: home Location provider: work Persons participating in the virtual visit: patient, provider.   I discussed the limitations, risks, security and privacy concerns of performing an evaluation and management service by telephone and the availability of in person appointments. I also discussed with the patient that there may be a patient responsible charge related to this service. The patient expressed understanding and verbally consented to this telephonic visit.    Interactive audio and video telecommunications were attempted between this provider and patient, however failed, due to patient having technical difficulties OR patient did not have access to video capability.  We continued and completed visit with audio only.    Review of Systems     Cardiac Risk Factors include: advanced age (>[id number], 55>65 women);male gender     Objective:    Today's Vitals   There is no height or weight on file to calculate BMI.     09/02/2021    8:39 AM 08/12/2020    3:00 PM 05/26/2015    6:19 AM 01/08/2015    7:06 AM  Advanced Directives  Does Patient Have a Medical Advance Directive? Yes Yes Yes No  Type of Estate agentAdvance Directive Healthcare Power of [place];Living will Healthcare Power of [place];Living will Healthcare Power of [place];Living will   Does patient want to make changes to medical advance directive?   No - Patient declined   Copy of Healthcare Power of Attorney in Chart? No - copy requested No - copy requested No - copy requested   Would patient like information on creating a medical advance directive?    No - patient declined information    Current Medications (verified) Outpatient Encounter Medications as of 09/02/2021  Medication  Sig   atorvastatin (LIPITOR) 80 MG tablet Take 1 tablet (80 mg total) by mouth at bedtime.   clopidogrel (PLAVIX) 75 MG tablet Take 1 tablet (75 mg total) by mouth daily.   metoprolol tartrate (LOPRESSOR) 25 MG tablet Take 1 tablet (25 mg total) by mouth 2 (two) times daily.   Multiple Vitamin (MULTI-VITAMINS) TABS Take 1 tablet by mouth daily.    nitroGLYCERIN (NITROSTAT) 0.4 MG SL tablet Place 1 tablet (0.4 mg total) under the tongue every 5 (five) minutes as needed for chest pain.   Omega-3 Fatty Acids (FISH OIL) 1000 MG CAPS Take 1,000 mg by mouth daily.    sildenafil (REVATIO) 20 MG tablet TAKE 3-5 TABLETS BY MOUTH AS NEEDED 1 HOUR PRIOR TO SEXUAL ACTIVITY   No facility-administered encounter medications on file as of 09/02/2021.    Allergies (verified) Patient has no known allergies.   History: Past Medical History:  Diagnosis Date   CAD (coronary artery disease)    a. PCIx1 in 1999 b. abnormal nuc, then cath 01/08/2015 DES to 75% mid LAD, 95% small distal LCx treated medically   Hyperlipidemia    Myocardial infarction [facility](HCC) 1999   Past Surgical History:  Procedure Laterality Date   CARDIAC CATHETERIZATION N/A 01/08/2015   Procedure: Left Heart Cath and Coronary Angiography;  Surgeon: Corky CraftsJayadeep S Varanasi, MD;  [facility];  Service: Cardiovascular;  Laterality: N/A;   CARDIAC CATHETERIZATION N/A 01/08/2015   Procedure: Coronary Stent Intervention;  Surgeon: Corky CraftsJayadeep S Varanasi, MD;  [facility];  Service: Cardiovascular;  Laterality: N/A;  mid lad 2.25x32 synergy   CARDIAC CATHETERIZATION  1999   CARDIAC CATHETERIZATION N/A 05/26/2015   Procedure: Left Heart Cath and Coronary Angiography;  Surgeon: Corky Crafts, MD;  Location: [facility] INVASIVE CV LAB;  Service: Cardiovascular;  Laterality: N/A;   CORONARY ANGIOPLASTY WITH STENT PLACEMENT  1999   "1 stent"   ENDOSCOPIC STENT PLACEMENT W/ MLB     KIDNEY SURGERY Right 1971   "had a blood vessel  removed off my kidney"   Family History  Problem Relation Age of Onset   CAD Mother    Social History   Socioeconomic History   Marital status: Married    Spouse name: Not on file   Number of children: Not on file   Years of education: Not on file   Highest education level: Not on file  Occupational History   Not on file  Tobacco Use   Smoking status: Never   Smokeless tobacco: Never  Vaping Use   Vaping Use: Never used  Substance and Sexual Activity   Alcohol use: No   Drug use: No   Sexual activity: Yes  Other Topics Concern   Not on file  Social History Narrative   Not on file   Social Determinants of Health   Financial Resource Strain: Low Risk  (09/02/2021)   Overall Financial Resource Strain (CARDIA)    Difficulty of Paying Living Expenses: Not hard at all  Food Insecurity: No Food Insecurity (09/02/2021)   Hunger Vital Sign    Worried About Running Out of Food in the Last Year: Never true    Ran Out of Food in the Last Year: Never true  Transportation Needs: No Transportation Needs (09/02/2021)   PRAPARE - Administrator, Civil Service (Medical): No    Lack of Transportation (Non-Medical): No  Physical Activity: Insufficiently Active (09/02/2021)   Exercise Vital Sign    Days of Exercise per Week: 3 days    Minutes of Exercise per Session: 30 min  Stress: No Stress Concern Present (09/02/2021)   [employer] of Occupational Health - Occupational Stress Questionnaire    Feeling of Stress : Not at all  Social Connections: Moderately Integrated (09/02/2021)   Social Connection and Isolation Panel [NHANES]    Frequency of Communication with Friends and Family: Three times a week    Frequency of Social Gatherings with Friends and Family: Three times a week    Attends Religious Services: More than 4 times per year    Active Member of Clubs or Organizations: No    Attends Banker Meetings: Never    Marital Status: Married    Tobacco  Counseling Counseling given: Not Answered   Clinical Intake:  Pre-visit preparation completed: Yes  Pain : No/denies pain     Nutritional Risks: None Diabetes: No  How often do you need to have someone help you when you read instructions, pamphlets, or other written materials from your doctor or pharmacy?: 1 - Never What is the last grade level you completed in school?: college  Diabetic?no   Interpreter Needed?: No  Information entered by :: L.Damoni Causby,LPN   Activities of Daily Living    09/02/2021    8:43 AM  In your present state of health, do you have any difficulty performing the following activities:  Hearing? 0  Vision? 0  Difficulty concentrating or making decisions? 0  Walking or climbing stairs? 0  Dressing or bathing?  0  Doing errands, shopping? 0  Preparing Food and eating ? N  Using the Toilet? N  In the past six months, have you accidently leaked urine? N  Do you have problems with loss of bowel control? N  Managing your Medications? N  Managing your Finances? N  Housekeeping or managing your Housekeeping? N    Patient Care Team: Mliss Sax, MD as PCP - General (Family Medicine) Corky Crafts, MD as PCP - Cardiology (Cardiology)  Indicate any recent Medical Services you may have received from other than [facility] providers in the past year (date may be approximate).     Assessment:   This is a routine wellness examination for [facility].  Hearing/Vision screen Vision Screening - Comments:: Referral 09/02/2021  Dietary issues and exercise activities discussed: Current Exercise Habits: Home exercise routine, Type of exercise: walking, Time (Minutes): 30, Frequency (Times/Week): 3, Weekly Exercise (Minutes/Week): 90, Intensity: Mild, Exercise limited by: None identified   Goals Addressed             This Visit's Progress    Patient Stated   On track    Drink more water & less soda & lose some weight       Depression  Screen    09/02/2021    8:41 AM 09/02/2021    8:38 AM 08/12/2020    3:02 PM 06/18/2020   10:02 AM 11/05/2019    1:42 PM 11/05/2019    1:12 PM  PHQ 2/9 Scores  PHQ - 2 Score 0 0 0 0 0 0  PHQ- 9 Score     0     Fall Risk    09/02/2021    8:41 AM 08/12/2020    3:01 PM 06/18/2020   10:02 AM 11/05/2019    1:12 PM  Fall Risk   Falls in the past year? 0 0 0 0  Number falls in past yr: 0 0    Injury with Fall? 0 0    Follow up Falls evaluation completed Falls prevention discussed      FALL RISK PREVENTION PERTAINING TO THE HOME:  Any stairs in or around the home? Yes  If so, are there any without handrails? No  Home free of loose throw rugs in walkways, pet beds, electrical cords, etc? Yes  Adequate lighting in your home to reduce risk of falls? Yes   ASSISTIVE DEVICES UTILIZED TO PREVENT FALLS:  Life alert? No  Use of a cane, walker or w/c? No  Grab bars in the bathroom? No  Shower chair or bench in shower? No  Elevated toilet seat or a handicapped toilet? No   Cognitive Function:    Normal cognitive status assessed by telephone conversation  by this Nurse Health Advisor. No abnormalities found.      Immunizations Immunization History  Administered Date(s) Administered   Fluad Quad(high Dose 65+) 04/01/2021   Hepatitis A 01/02/2014, 05/30/2014   Hepatitis A, Adult 01/02/2014, 05/30/2014   Influenza-Unspecified 01/17/2017   Moderna Sars-Covid-2 Vaccination 06/07/2019, 07/05/2019, 12/12/2019   Pneumococcal Polysaccharide-23 06/18/2020   Tdap 11/13/2008, 11/13/2008, 08/12/2015, 08/12/2015    TDAP status: Up to date  Flu Vaccine status: Up to date  Pneumococcal vaccine status: Up to date  Covid-19 vaccine status: Completed vaccines  Qualifies for Shingles Vaccine? Yes   Zostavax completed No   Shingrix Completed?: No.    Education has been provided regarding the importance of this vaccine. Patient has been advised to call insurance company to determine out of pocket  expense if they have not yet received this vaccine. Advised may also receive vaccine at local pharmacy or Health Dept. Verbalized acceptance and understanding.  Screening Tests Health Maintenance  Topic Date Due   Hepatitis C Screening  Never done   Zoster Vaccines- Shingrix (1 of 2) Never done   COVID-19 Vaccine (4 - Moderna series) 02/06/2020   Pneumonia Vaccine 5+ Years old (2 - PCV) 06/18/2021   INFLUENZA VACCINE  10/20/2021   TETANUS/TDAP  08/11/2025   COLONOSCOPY (Pts 45-48yrs Insurance coverage will need to be confirmed)  11/03/2028   HPV VACCINES  Aged Out    Health Maintenance  Health Maintenance Due  Topic Date Due   Hepatitis C Screening  Never done   Zoster Vaccines- Shingrix (1 of 2) Never done   COVID-19 Vaccine (4 - Moderna series) 02/06/2020   Pneumonia Vaccine 46+ Years old (2 - PCV) 06/18/2021    Colorectal cancer screening: Type of screening: Colonoscopy. Completed 11/04/2018. Repeat every 10 years  Lung Cancer Screening: (Low Dose CT Chest recommended if Age 66-80 years, 30 pack-year currently smoking OR have quit w/in 15years.) does not qualify.   Lung Cancer Screening Referral: n/a  Additional Screening:  Hepatitis C Screening: does not qualify;  Vision Screening: Recommended annual ophthalmology exams for early detection of glaucoma and other disorders of the eye. Is the patient up to date with their annual eye exam?  No  Who is the provider or what is the name of the office in which the patient attends annual eye exams? Referral 09/02/2021 If pt is not established with a provider, would they like to be referred to a provider to establish care? No .   Dental Screening: Recommended annual dental exams for proper oral hygiene  Community Resource Referral / Chronic Care Management: CRR required this visit?  No   CCM required this visit?  No      Plan:     I have personally reviewed and noted the following in the patient's chart:   Medical and  social history Use of alcohol, tobacco or illicit drugs  Current medications and supplements including opioid prescriptions. Patient is not currently taking opioid prescriptions. Functional ability and status Nutritional status Physical activity Advanced directives List of other physicians Hospitalizations, surgeries, and ER visits in previous 12 months Vitals Screenings to include cognitive, depression, and falls Referrals and appointments  In addition, I have reviewed and discussed with patient certain preventive protocols, quality metrics, and best practice recommendations. A written personalized care plan for preventive services as well as general preventive health recommendations were provided to patient.     March Rummage, LPN   07/05/6061   Nurse Notes: none

## 2021-09-02 NOTE — Patient Instructions (Signed)
[name] , Thank you for taking time to come for your Medicare Wellness Visit. I appreciate your ongoing commitment to your health goals. Please review the following plan we discussed and let me know if I can assist you in the future.   Screening recommendations/referrals: Colonoscopy: 11/04/2018 Recommended yearly ophthalmology/optometry visit for glaucoma screening and checkup Recommended yearly dental visit for hygiene and checkup  Vaccinations: Influenza vaccine: completed  Pneumococcal vaccine: completed  Tdap vaccine: 08/12/2015 Shingles vaccine: will consider     Advanced directives: yes   Conditions/risks identified: none   Next appointment: none   Preventive Care 65 Years and Older, Male Preventive care refers to lifestyle choices and visits with your health care provider that can promote health and wellness. What does preventive care include? A yearly physical exam. This is also called an annual well check. Dental exams once or twice a year. Routine eye exams. Ask your health care provider how often you should have your eyes checked. Personal lifestyle choices, including: Daily care of your teeth and gums. Regular physical activity. Eating a healthy diet. Avoiding tobacco and drug use. Limiting alcohol use. Practicing safe sex. Taking low doses of aspirin every day. Taking vitamin and mineral supplements as recommended by your health care provider. What happens during an annual well check? The services and screenings done by your health care provider during your annual well check will depend on your age, overall health, lifestyle risk factors, and family history of disease. Counseling  Your health care provider may ask you questions about your: Alcohol use. Tobacco use. Drug use. Emotional well-being. Home and relationship well-being. Sexual activity. Eating habits. History of falls. Memory and ability to understand (cognition). Work and work  Astronomer. Screening  You may have the following tests or measurements: Height, weight, and BMI. Blood pressure. Lipid and cholesterol levels. These may be checked every 5 years, or more frequently if you are over 19 years old. Skin check. Lung cancer screening. You may have this screening every year starting at age 36 if you have a 30-pack-year history of smoking and currently smoke or have quit within the past 15 years. Fecal occult blood test (FOBT) of the stool. You may have this test every year starting at age 52. Flexible sigmoidoscopy or colonoscopy. You may have a sigmoidoscopy every 5 years or a colonoscopy every 10 years starting at age 41. Prostate cancer screening. Recommendations will vary depending on your family history and other risks. Hepatitis C blood test. Hepatitis B blood test. Sexually transmitted disease (STD) testing. Diabetes screening. This is done by checking your blood sugar (glucose) after you have not eaten for a while (fasting). You may have this done every 1-3 years. Abdominal aortic aneurysm (AAA) screening. You may need this if you are a current or former smoker. Osteoporosis. You may be screened starting at age 70 if you are at high risk. Talk with your health care provider about your test results, treatment options, and if necessary, the need for more tests. Vaccines  Your health care provider may recommend certain vaccines, such as: Influenza vaccine. This is recommended every year. Tetanus, diphtheria, and acellular pertussis (Tdap, Td) vaccine. You may need a Td booster every 10 years. Zoster vaccine. You may need this after age 19. Pneumococcal 13-valent conjugate (PCV13) vaccine. One dose is recommended after age 68. Pneumococcal polysaccharide (PPSV23) vaccine. One dose is recommended after age 39. Talk to your health care provider about which screenings and vaccines you need and how often you  need them. This information is not intended to replace  advice given to you by your health care provider. Make sure you discuss any questions you have with your health care provider. Document Released: 04/04/2015 Document Revised: 11/26/2015 Document Reviewed: 01/07/2015 Elsevier Interactive Patient Education  2017 [place] Prevention in the Home Falls can cause injuries. They can happen to people of all ages. There are many things you can do to make your home safe and to help prevent falls. What can I do on the outside of my home? Regularly fix the edges of walkways and driveways and fix any cracks. Remove anything that might make you trip as you walk through a door, such as a raised step or threshold. Trim any bushes or trees on the path to your home. Use bright outdoor lighting. Clear any walking paths of anything that might make someone trip, such as rocks or tools. Regularly check to see if handrails are loose or broken. Make sure that both sides of any steps have handrails. Any raised decks and porches should have guardrails on the edges. Have any leaves, snow, or ice cleared regularly. Use sand or salt on walking paths during winter. Clean up any spills in your garage right away. This includes oil or grease spills. What can I do in the bathroom? Use night lights. Install grab bars by the toilet and in the tub and shower. Do not use towel bars as grab bars. Use non-skid mats or decals in the tub or shower. If you need to sit down in the shower, use a plastic, non-slip stool. Keep the floor dry. Clean up any water that spills on the floor as soon as it happens. Remove soap buildup in the tub or shower regularly. Attach bath mats securely with double-sided non-slip rug tape. Do not have throw rugs and other things on the floor that can make you trip. What can I do in the bedroom? Use night lights. Make sure that you have a light by your bed that is easy to reach. Do not use any sheets or blankets that are too big for your bed.  They should not hang down onto the floor. Have a firm chair that has side arms. You can use this for support while you get dressed. Do not have throw rugs and other things on the floor that can make you trip. What can I do in the kitchen? Clean up any spills right away. Avoid walking on wet floors. Keep items that you use a lot in easy-to-reach places. If you need to reach something above you, use a strong step stool that has a grab bar. Keep electrical cords out of the way. Do not use floor polish or wax that makes floors slippery. If you must use wax, use non-skid floor wax. Do not have throw rugs and other things on the floor that can make you trip. What can I do with my stairs? Do not leave any items on the stairs. Make sure that there are handrails on both sides of the stairs and use them. Fix handrails that are broken or loose. Make sure that handrails are as long as the stairways. Check any carpeting to make sure that it is firmly attached to the stairs. Fix any carpet that is loose or worn. Avoid having throw rugs at the top or bottom of the stairs. If you do have throw rugs, attach them to the floor with carpet tape. Make sure that you have a light switch  at the top of the stairs and the bottom of the stairs. If you do not have them, ask someone to add them for you. What else can I do to help prevent falls? Wear shoes that: Do not have high heels. Have rubber bottoms. Are comfortable and fit you well. Are closed at the toe. Do not wear sandals. If you use a stepladder: Make sure that it is fully opened. Do not climb a closed stepladder. Make sure that both sides of the stepladder are locked into place. Ask someone to hold it for you, if possible. Clearly mark and make sure that you can see: Any grab bars or handrails. First and last steps. Where the edge of each step is. Use tools that help you move around (mobility aids) if they are needed. These  include: Canes. Walkers. Scooters. Crutches. Turn on the lights when you go into a dark area. Replace any light bulbs as soon as they burn out. Set up your furniture so you have a clear path. Avoid moving your furniture around. If any of your floors are uneven, fix them. If there are any pets around you, be aware of where they are. Review your medicines with your doctor. Some medicines can make you feel dizzy. This can increase your chance of falling. Ask your doctor what other things that you can do to help prevent falls. This information is not intended to replace advice given to you by your health care provider. Make sure you discuss any questions you have with your health care provider. Document Released: 01/02/2009 Document Revised: 08/14/2015 Document Reviewed: 04/12/2014 Elsevier Interactive Patient Education  2017 [employer].

## 2021-09-08 ENCOUNTER — Other Ambulatory Visit: Payer: Medicare Other

## 2021-09-29 ENCOUNTER — Ambulatory Visit
Admission: RE | Admit: 2021-09-29 | Discharge: 2021-09-29 | Disposition: A | Discharge status: Home / Self Care | Payer: Medicare Other | Source: Ambulatory Visit | Attending: Surgery | Admitting: Surgery

## 2021-09-29 DIAGNOSIS — K429 Umbilical hernia without obstruction or gangrene: Secondary | ICD-10-CM

## 2021-09-29 MED ORDER — IOPAMIDOL (ISOVUE-300) INJECTION 61%
100.0000 mL | Freq: Once | INTRAVENOUS | Status: AC | PRN
  Administered 2021-09-29: 100 mL via INTRAVENOUS

## 2021-11-11 ENCOUNTER — Other Ambulatory Visit: Payer: Self-pay | Admitting: Interventional Cardiology

## 2021-11-11 NOTE — Telephone Encounter (Signed)
Pt's pharmacy is requesting a refill on sildenafil. Would Dr. Varanasi like to refill this medication? Please address 

## 2021-11-13 NOTE — Telephone Encounter (Signed)
OK to refill

## 2022-03-19 ENCOUNTER — Inpatient Hospital Stay (HOSPITAL_COMMUNITY): Admission: RE | Admit: 2022-03-19 | Payer: Medicare Other | Source: Ambulatory Visit

## 2022-03-23 ENCOUNTER — Telehealth: Payer: Self-pay | Admitting: Interventional Cardiology

## 2022-03-23 DIAGNOSIS — R072 Precordial pain: Secondary | ICD-10-CM

## 2022-03-23 DIAGNOSIS — I25118 Atherosclerotic heart disease of native coronary artery with other forms of angina pectoris: Secondary | ICD-10-CM

## 2022-03-23 NOTE — Telephone Encounter (Signed)
Patient is calling requesting an order be placed for him to have a stress test due to not having one since 2021.

## 2022-03-23 NOTE — Telephone Encounter (Signed)
Dr. [name], the pt is calling in requesting if he needs to have a stress test done prior to seeing you in follow-up on 2/26, being he states he has not had one since 2021.  Please advise and triage will follow-up with the pt accordingly thereafter. Thanks!

## 2022-03-24 NOTE — Telephone Encounter (Signed)
He only needs stress test if he is having new symptoms or needs it for the [place].

## 2022-03-25 ENCOUNTER — Other Ambulatory Visit: Payer: Self-pay | Admitting: Interventional Cardiology

## 2022-03-25 ENCOUNTER — Encounter: Payer: Self-pay | Admitting: *Deleted

## 2022-03-25 DIAGNOSIS — E782 Mixed hyperlipidemia: Secondary | ICD-10-CM

## 2022-03-25 DIAGNOSIS — I2510 Atherosclerotic heart disease of native coronary artery without angina pectoris: Secondary | ICD-10-CM

## 2022-03-25 NOTE — Telephone Encounter (Signed)
Would order PET/CT stress test.

## 2022-03-25 NOTE — Telephone Encounter (Signed)
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

## 2022-03-25 NOTE — Telephone Encounter (Signed)
Patient notified. He would like to proceed with PET/CT test. I verbally went over instructions with him and will send to him through my chart.

## 2022-03-25 NOTE — Telephone Encounter (Signed)
I spoke with patient. 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  He also reports chest tightness sometimes when walking up a hill.  Not every time. Resolves quickly with rest.  Has NTG but has not needed to use.  Also has pain under left arm into his arm pit area on occasion. Will check with Dr [name] to see what type of stress test patient should have

## 2022-04-16 ENCOUNTER — Telehealth (HOSPITAL_COMMUNITY): Payer: Self-pay | Admitting: *Deleted

## 2022-04-16 NOTE — Telephone Encounter (Signed)
Reaching out to patient to offer assistance regarding upcoming cardiac imaging study; pt verbalizes understanding of appt date/time, parking situation and where to check in, pre-test NPO status, and verified current allergies; name and call back number provided for further questions should they arise  Coree Brame RN Navigator Cardiac Imaging [facility] [phone] office [phone] cell  Patient aware to avoid caffeine 12 hours prior to his cardiac PET scan. 

## 2022-04-20 ENCOUNTER — Encounter (HOSPITAL_COMMUNITY)
Admission: RE | Admit: 2022-04-20 | Discharge: 2022-04-20 | Disposition: A | Discharge status: Home / Self Care | Payer: Medicare Other | Source: Ambulatory Visit | Attending: Interventional Cardiology | Admitting: Interventional Cardiology

## 2022-04-20 DIAGNOSIS — R072 Precordial pain: Secondary | ICD-10-CM | POA: Diagnosis present

## 2022-04-20 LAB — NM PET CT CARDIAC PERFUSION MULTI W/ABSOLUTE BLOODFLOW
LV dias vol: 108 mL (ref 62–150)
MBFR: 2.85
Nuc Rest EF: 56 %
Nuc Stress EF: 62 %
Peak HR: 108 {beats}/min
Rest HR: 74 {beats}/min
Rest MBF: 0.82 ml/g/min
Rest Nuclear Isotope Dose: 22.6 mCi
Rest perfusion cavity size (mL): 108 mL
ST Depression (mm): 0 mm
Stress MBF: 2.34 ml/g/min
Stress Nuclear Isotope Dose: 22.7 mCi
Stress perfusion cavity size (mL): 105 mL
TID: 0.95

## 2022-04-20 MED ORDER — REGADENOSON 0.4 MG/5ML IV SOLN: 0.4000 mg | Freq: Once | INTRAVENOUS | Status: AC

## 2022-04-20 MED ORDER — RUBIDIUM RB82 GENERATOR (RUBYFILL)
22.0000 | PACK | Freq: Once | INTRAVENOUS | Status: AC
  Administered 2022-04-20: 22 via INTRAVENOUS

## 2022-04-20 MED ORDER — REGADENOSON 0.4 MG/5ML IV SOLN
INTRAVENOUS | Status: AC
  Administered 2022-04-20: 0.4 mg via INTRAVENOUS
  Filled 2022-04-20: qty 5

## 2022-04-20 NOTE — Progress Notes (Signed)
Patient presents for a cardiac PET stress test and tolerated procedure without incident. Patient maintained acceptable vital signs throughout the test and was offered caffeine after test.  Patient ambulated out of department with a steady gait.  

## 2022-05-03 ENCOUNTER — Ambulatory Visit: Payer: Self-pay | Admitting: Surgery

## 2022-05-03 NOTE — Pre-Procedure Instructions (Signed)
Surgical Instructions    Your procedure is scheduled on Tuesday, February 20th.  Report to [facility] Main Entrance "A" at 11:00 A.M., then check in with the Admitting office.  Call this number if you have problems the morning of surgery:  [phone]  If you have any questions prior to your surgery date call [phone]: Open Monday-Friday 8am-4pm If you experience any cold or flu symptoms such as cough, fever, chills, shortness of breath, etc. between now and your scheduled surgery, please notify us at the above number.     Remember:  Do not eat after midnight the night before your surgery  You may drink clear liquids until 10:00 AM the morning of your surgery.   Clear liquids allowed are: Water, Non-Citrus Juices (without pulp), Carbonated Beverages, Clear Tea, Black Coffee Only (NO MILK, CREAM OR POWDERED CREAMER of any kind), and Gatorade.    Take these medicines the morning of surgery with A SIP OF WATER  metoprolol tartrate (LOPRESSOR)    If needed: nitroGLYCERIN (NITROSTAT)    Follow your surgeon's instructions on when to stop Plavix.  If no instructions were given by your surgeon then you will need to call the office to get those instructions.     As of today, STOP taking any Aspirin (unless otherwise instructed by your surgeon) Aleve, Naproxen, Ibuprofen, Motrin, Advil, Goody's, BC's, all herbal medications, fish oil, and all vitamins.                     Do NOT Smoke (Tobacco/Vaping) for 24 hours prior to your procedure.  If you use a CPAP at night, you may bring your mask/headgear for your overnight stay.   Contacts, glasses, piercing's, hearing aid's, dentures or partials may not be worn into surgery, please bring cases for these belongings.    For patients admitted to the hospital, discharge time will be determined by your treatment team.   Patients discharged the day of surgery will not be allowed to drive home, and someone needs to stay with them for 24  hours.  SURGICAL WAITING ROOM VISITATION Patients having surgery or a procedure may have no more than 2 support people in the waiting area - these visitors may rotate.   Children under the age of 4 must have an adult with them who is not the patient. If the patient needs to stay at the hospital during part of their recovery, the visitor guidelines for inpatient rooms apply. Pre-op nurse will coordinate an appropriate time for 1 support person to accompany patient in pre-op.  This support person may not rotate.   Please refer to the [facility] website for the visitor guidelines for Inpatients (after your surgery is over and you are in a regular room).    Special instructions:   [facility]- Preparing For Surgery  Before surgery, you can play an important role. Because skin is not sterile, your skin needs to be as free of germs as possible. You can reduce the number of germs on your skin by washing with CHG (chlorahexidine gluconate) Soap before surgery.  CHG is an antiseptic cleaner which kills germs and bonds with the skin to continue killing germs even after washing.    Oral Hygiene is also important to reduce your risk of infection.  Remember - BRUSH YOUR TEETH THE MORNING OF SURGERY WITH YOUR REGULAR TOOTHPASTE  Please do not use if you have an allergy to CHG or antibacterial soaps. If your skin becomes reddened/irritated stop using the  CHG.  Do not shave (including legs and underarms) for at least 48 hours prior to first CHG shower. It is OK to shave your face.  Please follow these instructions carefully.   Shower the NIGHT BEFORE SURGERY and the MORNING OF SURGERY  If you chose to wash your hair, wash your hair first as usual with your normal shampoo.  After you shampoo, rinse your hair and body thoroughly to remove the shampoo.  Use CHG Soap as you would any other liquid soap. You can apply CHG directly to the skin and wash gently with a scrungie or a clean washcloth.   Apply  the CHG Soap to your body ONLY FROM THE NECK DOWN.  Do not use on open wounds or open sores. Avoid contact with your eyes, ears, mouth and genitals (private parts). Wash Face and genitals (private parts)  with your normal soap.   Wash thoroughly, paying special attention to the area where your surgery will be performed.  Thoroughly rinse your body with warm water from the neck down.  DO NOT shower/wash with your normal soap after using and rinsing off the CHG Soap.  Pat yourself dry with a CLEAN TOWEL.  Wear CLEAN PAJAMAS to bed the night before surgery  Place CLEAN SHEETS on your bed the night before your surgery  DO NOT SLEEP WITH PETS.   Day of Surgery: Take a shower with CHG soap. Do not wear jewelry Do not wear lotions, powders, colognes, or deodorant. Men may shave face and neck. Do not bring valuables to the hospital. [facility] is not responsible for any belongings or valuables.  Wear Clean/Comfortable clothing the morning of surgery Remember to brush your teeth WITH YOUR REGULAR TOOTHPASTE.   Please read over the following fact sheets that you were given.    If you received a COVID test during your pre-op visit  it is requested that you wear a mask when out in public, stay away from anyone that may not be feeling well and notify your surgeon if you develop symptoms. If you have been in contact with anyone that has tested positive in the last 10 days please notify you surgeon.

## 2022-05-04 ENCOUNTER — Other Ambulatory Visit: Payer: Self-pay

## 2022-05-04 ENCOUNTER — Telehealth: Payer: Self-pay | Admitting: *Deleted

## 2022-05-04 ENCOUNTER — Encounter (HOSPITAL_COMMUNITY)
Admission: RE | Admit: 2022-05-04 | Discharge: 2022-05-04 | Disposition: A | Discharge status: Home / Self Care | Payer: Medicare Other | Source: Ambulatory Visit | Attending: Surgery | Admitting: Surgery

## 2022-05-04 ENCOUNTER — Encounter (HOSPITAL_COMMUNITY): Payer: Self-pay

## 2022-05-04 VITALS — BP 111/71 | HR 61 | Temp 97.6°F | Resp 17 | Ht 69.0 in | Wt 187.0 lb

## 2022-05-04 DIAGNOSIS — I252 Old myocardial infarction: Secondary | ICD-10-CM | POA: Insufficient documentation

## 2022-05-04 DIAGNOSIS — E785 Hyperlipidemia, unspecified: Secondary | ICD-10-CM | POA: Diagnosis not present

## 2022-05-04 DIAGNOSIS — I2510 Atherosclerotic heart disease of native coronary artery without angina pectoris: Secondary | ICD-10-CM | POA: Diagnosis not present

## 2022-05-04 DIAGNOSIS — Z01818 Encounter for other preprocedural examination: Secondary | ICD-10-CM | POA: Insufficient documentation

## 2022-05-04 DIAGNOSIS — Z7902 Long term (current) use of antithrombotics/antiplatelets: Secondary | ICD-10-CM | POA: Insufficient documentation

## 2022-05-04 DIAGNOSIS — Z955 Presence of coronary angioplasty implant and graft: Secondary | ICD-10-CM | POA: Diagnosis not present

## 2022-05-04 DIAGNOSIS — I10 Essential (primary) hypertension: Secondary | ICD-10-CM | POA: Insufficient documentation

## 2022-05-04 HISTORY — DX: Essential (primary) hypertension: I10

## 2022-05-04 LAB — CBC
HCT: 46.2 % (ref 39.0–52.0)
Hemoglobin: 15.1 g/dL (ref 13.0–17.0)
MCH: 29.6 pg (ref 26.0–34.0)
MCHC: 32.7 g/dL (ref 30.0–36.0)
MCV: 90.6 fL (ref 80.0–100.0)
Platelets: 196 10*3/uL (ref 150–400)
RBC: 5.1 MIL/uL (ref 4.22–5.81)
RDW: 12.8 % (ref 11.5–15.5)
WBC: 5.8 10*3/uL (ref 4.0–10.5)
nRBC: 0 % (ref 0.0–0.2)

## 2022-05-04 LAB — BASIC METABOLIC PANEL
Anion gap: 9 (ref 5–15)
BUN: 17 mg/dL (ref 8–23)
CO2: 23 mmol/L (ref 22–32)
Calcium: 9.1 mg/dL (ref 8.9–10.3)
Chloride: 104 mmol/L (ref 98–111)
Creatinine, Ser: 0.88 mg/dL (ref 0.61–1.24)
GFR, Estimated: >60 mL/min (ref >60)
Glucose, Bld: 105 mg/dL — ABNORMAL HIGH (ref 70–99)
Potassium: 4 mmol/L (ref 3.5–5.1)
Sodium: 136 mmol/L (ref 135–145)

## 2022-05-04 NOTE — Telephone Encounter (Signed)
I s/w the pt about sooner appt for pre op clearance. Pt was offered appt here at [facility] 2/16 but will be out of town. Pt has been scheduled to see Coletta Memos, FNP at [facility] tomorrow 05/05/22 @ 10:30. Pt has been given address for the [facility] location. Pt thanked me for the help in this matter. I will update all parties involved.

## 2022-05-04 NOTE — Progress Notes (Signed)
PCP - Dr.William Ethelene Hal Cardiologist - Dr.Jayadeep [name]  PPM/ICD - pt denies Device Orders - n/a Rep Notified - n/a  Chest x-ray - n/a EKG - 05/04/22 Stress Test - 04/20/22 ECHO - 12/19/14 Cardiac Cath - 05/26/15  Sleep Study - pt denies CPAP - n/a  Fasting Blood Sugar - pt denies Diabetes  Checks Blood Sugar _____ times a day  Last dose of GLP1 agonist-  pt denies GLP1 instructions: n/a  Blood Thinner Instructions:Follow your surgeon's instructions on when to stop Plavix.  If no instructions were given by your surgeon then you will need to call the office to get those instructions.    Aspirin Instructions:pt denies  ERAS Protcol -NPO after Midnight  PRE-SURGERY Ensure or G2- none ordered   COVID TEST- n/a   Anesthesia review: YES, heart history.   Patient denies shortness of breath, fever, cough and chest pain at PAT appointment   All instructions explained to the patient, with a verbal understanding of the material. Patient agrees to go over the instructions while at home for a better understanding. Patient also instructed to self quarantine after being tested for COVID-19. The opportunity to ask questions was provided.

## 2022-05-04 NOTE — Telephone Encounter (Signed)
   Name: Timothy Hayden  DOB: [birth year]  MRN: [id number]  Primary Cardiologist: Larae Grooms, MD  Chart reviewed as part of pre-operative protocol coverage. Because of Timothy Hayden's past medical history and time since last visit, he will require a follow-up in-office visit in order to better assess preoperative cardiovascular risk.  Pre-op covering staff: - Please schedule appointment and call patient to inform them. If patient already had an upcoming appointment within acceptable timeframe, please add "pre-op clearance" to the appointment notes so provider is aware. - Please contact requesting surgeon's office via preferred method (i.e, phone, fax) to inform them of need for appointment prior to surgery.    Mable Fill, Marissa Nestle, NP  05/04/2022, 4:19 PM

## 2022-05-04 NOTE — Pre-Procedure Instructions (Signed)
Surgical Instructions    Your procedure is scheduled on Tuesday, February 20th.  Report to [facility] Main Entrance "A" at 11:00 A.M., then check in with the Admitting office.  Call this number if you have problems the morning of surgery:  [phone]  If you have any questions prior to your surgery date call [phone]: Open Monday-Friday 8am-4pm If you experience any cold or flu symptoms such as cough, fever, chills, shortness of breath, etc. between now and your scheduled surgery, please notify us at the above number.     Remember:  Do not eat or drink after midnight the night before your surgery     Take these medicines the morning of surgery with A SIP OF WATER  metoprolol tartrate (LOPRESSOR)    If needed: nitroGLYCERIN (NITROSTAT)    Follow your surgeon's instructions on when to stop Plavix.  If no instructions were given by your surgeon then you will need to call the office to get those instructions.     As of today, STOP taking any Aspirin (unless otherwise instructed by your surgeon) Aleve, Naproxen, Ibuprofen, Motrin, Advil, Goody's, BC's, all herbal medications, fish oil, and all vitamins.                     Do NOT Smoke (Tobacco/Vaping) for 24 hours prior to your procedure.  If you use a CPAP at night, you may bring your mask/headgear for your overnight stay.   Contacts, glasses, piercing's, hearing aid's, dentures or partials may not be worn into surgery, please bring cases for these belongings.    For patients admitted to the hospital, discharge time will be determined by your treatment team.   Patients discharged the day of surgery will not be allowed to drive home, and someone needs to stay with them for 24 hours.  SURGICAL WAITING ROOM VISITATION Patients having surgery or a procedure may have no more than 2 support people in the waiting area - these visitors may rotate.   Children under the age of 67 must have an adult with them who is not the  patient. If the patient needs to stay at the hospital during part of their recovery, the visitor guidelines for inpatient rooms apply. Pre-op nurse will coordinate an appropriate time for 1 support person to accompany patient in pre-op.  This support person may not rotate.   Please refer to the [facility] website for the visitor guidelines for Inpatients (after your surgery is over and you are in a regular room).    Special instructions:   [facility]- Preparing For Surgery  Before surgery, you can play an important role. Because skin is not sterile, your skin needs to be as free of germs as possible. You can reduce the number of germs on your skin by washing with CHG (chlorahexidine gluconate) Soap before surgery.  CHG is an antiseptic cleaner which kills germs and bonds with the skin to continue killing germs even after washing.    Oral Hygiene is also important to reduce your risk of infection.  Remember - BRUSH YOUR TEETH THE MORNING OF SURGERY WITH YOUR REGULAR TOOTHPASTE  Please do not use if you have an allergy to CHG or antibacterial soaps. If your skin becomes reddened/irritated stop using the CHG.  Do not shave (including legs and underarms) for at least 48 hours prior to first CHG shower. It is OK to shave your face.  Please follow these instructions carefully.   Shower the [employer] SURGERY and  the MORNING OF SURGERY  If you chose to wash your hair, wash your hair first as usual with your normal shampoo.  After you shampoo, rinse your hair and body thoroughly to remove the shampoo.  Use CHG Soap as you would any other liquid soap. You can apply CHG directly to the skin and wash gently with a scrungie or a clean washcloth.   Apply the CHG Soap to your body ONLY FROM THE NECK DOWN.  Do not use on open wounds or open sores. Avoid contact with your eyes, ears, mouth and genitals (private parts). Wash Face and genitals (private parts)  with your normal soap.   Wash thoroughly,  paying special attention to the area where your surgery will be performed.  Thoroughly rinse your body with warm water from the neck down.  DO NOT shower/wash with your normal soap after using and rinsing off the CHG Soap.  Pat yourself dry with a CLEAN TOWEL.  Wear CLEAN PAJAMAS to bed the night before surgery  Place CLEAN SHEETS on your bed the night before your surgery  DO NOT SLEEP WITH PETS.   Day of Surgery: Take a shower with CHG soap. Do not wear jewelry Do not wear lotions, powders, colognes, or deodorant. Men may shave face and neck. Do not bring valuables to the hospital. [facility] is not responsible for any belongings or valuables.  Wear Clean/Comfortable clothing the morning of surgery Remember to brush your teeth WITH YOUR REGULAR TOOTHPASTE.   Please read over the following fact sheets that you were given.    If you received a COVID test during your pre-op visit  it is requested that you wear a mask when out in public, stay away from anyone that may not be feeling well and notify your surgeon if you develop symptoms. If you have been in contact with anyone that has tested positive in the last 10 days please notify you surgeon.

## 2022-05-04 NOTE — Telephone Encounter (Signed)
   Pre-operative Risk Assessment    Patient Name: Timothy Hayden  DOB: 09/28/1955 MRN: [id number]      Request for Surgical Clearance    Procedure:   HERNIA SURGERY  Date of Surgery:  Clearance 05/11/22; URGENT                                  Surgeon:  DR. Reather Laurence Surgeon's Group or Practice Name:  [facility]; [facility] Phone number:  [phone] Fax number:  [phone] ATTN: Emeline Gins, CMA   Type of Clearance Requested:   - Medical  - Pharmacy:  Hold Clopidogrel (Plavix)     Type of Anesthesia:  General    Additional requests/questions:    [name]   05/04/2022, 4:13 PM

## 2022-05-05 ENCOUNTER — Encounter: Payer: Self-pay | Admitting: General Practice

## 2022-05-05 ENCOUNTER — Ambulatory Visit: Payer: Medicare Other | Attending: General Practice | Admitting: General Practice

## 2022-05-05 VITALS — BP 124/60 | HR 66 | Ht 69.0 in | Wt 188.0 lb

## 2022-05-05 DIAGNOSIS — E782 Mixed hyperlipidemia: Secondary | ICD-10-CM | POA: Diagnosis not present

## 2022-05-05 DIAGNOSIS — I10 Essential (primary) hypertension: Secondary | ICD-10-CM | POA: Insufficient documentation

## 2022-05-05 DIAGNOSIS — Z01810 Encounter for preprocedural cardiovascular examination: Secondary | ICD-10-CM | POA: Insufficient documentation

## 2022-05-05 DIAGNOSIS — I25118 Atherosclerotic heart disease of native coronary artery with other forms of angina pectoris: Secondary | ICD-10-CM | POA: Diagnosis not present

## 2022-05-05 NOTE — Patient Instructions (Signed)
Medication Instructions:  The current medical regimen is effective;  continue present plan and medications as directed. Please refer to the Current Medication list given to you today.  *If you need a refill on your cardiac medications before your next appointment, please call your pharmacy*  Lab Work: FASTING LIPID AND LFT SOON If you have labs (blood work) drawn today and your tests are completely normal, you will receive your results only by: [place] (if you have MyChart) OR A paper copy in the mail If you have any lab test that is abnormal or we need to change your treatment, we will call you to review the results.  Testing/Procedures: NONE  Other Instructions ACCEPTABLE RISK FOR UPCOMING SURGERY  MAINTAIN BLOOD PRESSURE LOG   PLEASE READ AND FOLLOW ATTACHED  SALTY 6   Follow-Up: At [facility], you and your health needs are our priority.  As part of our continuing mission to provide you with exceptional heart care, we have created designated Provider Care Teams.  These Care Teams include your primary Cardiologist (physician) and Advanced Practice Providers (APPs -  Physician Assistants and Nurse Practitioners) who all work together to provide you with the care you need, when you need it.  Your next appointment:   KEEP SCHEDULED APPOINTMENT   Provider:   Larae Grooms, MD

## 2022-05-05 NOTE — Anesthesia Preprocedure Evaluation (Addendum)
Anesthesia Evaluation  Patient identified by MRN, date of birth, ID band Patient awake    Reviewed: Allergy & Precautions, H&P , NPO status , Patient's Chart, lab work & pertinent test results  Airway Mallampati: I  TM Distance: >3 FB Neck ROM: Full    Dental no notable dental hx. (+) Teeth Intact, Dental Advisory Given   Pulmonary neg pulmonary ROS   Pulmonary exam normal breath sounds clear to auscultation       Cardiovascular Exercise Tolerance: Good hypertension, Pt. on medications + CAD, + Past MI and + Cardiac Stents  negative cardio ROS Normal cardiovascular exam Rhythm:Regular Rate:Normal     Neuro/Psych negative neurological ROS  negative psych ROS   GI/Hepatic negative GI ROS, Neg liver ROS,,,  Endo/Other  negative endocrine ROS    Renal/GU negative Renal ROS  negative genitourinary   Musculoskeletal negative musculoskeletal ROS (+)    Abdominal   Peds negative pediatric ROS (+)  Hematology negative hematology ROS (+)   Anesthesia Other Findings   Reproductive/Obstetrics negative OB ROS                             Anesthesia Physical Anesthesia Plan  ASA: 3  Anesthesia Plan: General   Post-op Pain Management: Toradol IV (intra-op)* and Ofirmev IV (intra-op)*   Induction: Intravenous  PONV Risk Score and Plan: 2  Airway Management Planned: Oral ETT and Video Laryngoscope Planned  Additional Equipment: None  Intra-op Plan:   Post-operative Plan: Extubation in OR  Informed Consent: I have reviewed the patients History and Physical, chart, labs and discussed the procedure including the risks, benefits and alternatives for the proposed anesthesia with the patient or authorized representative who has indicated his/her understanding and acceptance.       Plan Discussed with: Anesthesiologist and CRNA  Anesthesia Plan Comments: (PAT note by Karoline Caldwell,  PA-C: Follows with cardiology for history of CAD s/p remote PCI in 1999 with subsequent DES-LAD and LCx (2016 and 2017),hyperlipidemia, HTN.  Nuclear stress test 01/2020 showed EF 49%, intermediate risk, and no T wave inversion.  Cardiac PET CT 04/20/2022 was low risk.  Seen by Coletta Memos, NP 05/05/2022 for preop evaluation.  Per note, "Chart reviewed as part of pre-operative protocol coverage. Given past medical history and time since last visit, based on ACC/AHA guidelines,Timothy A Brewerwould be at acceptable risk for the planned procedure without further cardiovascular testing. His RCRI is a class IV risk, 11% risk of major cardiac event. He is able to complete greater than 4 METS of physical activity. Patient was advised that if he/ develops new symptoms prior to surgery to contact our office to arrange a follow-up appointment. He verbalized understanding."  He was also instructed at that time he could hold Plavix for 5 days prior to surgery.  Preop labs reviewed, WNL.  EKG 05/05/2022: NSR. Rate 66.  Cardiac PET/CT 04/20/2022:  Medium size, moderate defect, that is largely fixed with partial reversibility in the mid to apical anterior,anterolateral segments consistent with prior infarction in the LAD distribution with likely peri-infarct ischemia. No other findings to suggest ischemia. Global MBF is normal event in the LAD distribution (can be seen with small infarcts). Normal LVEF response to stress. Normal LV function. Overall, this is a low risk study given the patient's prior history of MI.  LV perfusion is abnormal. There is evidence of infarction. Defect 1: There is a medium defect with moderate reduction in uptake present  in the apical to mid anterior and anterolateral location(s) that is partially reversible. There is abnormal wall motion in the defect area. Consistent with infarction and peri-infarct ischemia. The defect is consistent with abnormal perfusion in the LAD territory.   Rest left ventricular function is normal. Rest EF: 56 %. Stress left ventricular function is normal. Stress EF: 62 %. End diastolic cavity size is normal. End systolic cavity size is normal.  Myocardial blood flow was computed to be 0.82m/g/min at rest and 2.352mg/min at stress. Global myocardial blood flow reserve was 2.85 and was normal.  Coronary calcium assessment not performed due to prior revascularization.  Findings are consistent with infarction with peri-infarct ischemia. The study is low risk.  )        Anesthesia Quick Evaluation

## 2022-05-05 NOTE — Progress Notes (Signed)
Anesthesia Chart Review:  Follows with cardiology for history of CAD s/p remote PCI in 1999 with subsequent DES-LAD and LCx (2016 and 2017), hyperlipidemia, HTN.  Nuclear stress test 01/2020 showed EF 49%, intermediate risk, and no T wave inversion.  Cardiac PET CT 04/20/2022 was low risk.  Seen by Coletta Memos, NP 05/05/2022 for preop evaluation.  Per note, "Chart reviewed as part of pre-operative protocol coverage. Given past medical history and time since last visit, based on ACC/AHA guidelines, Timothy Hayden would be at acceptable risk for the planned procedure without further cardiovascular testing. His RCRI is a class IV risk, 11% risk of major cardiac event.  He is able to complete greater than 4 METS of physical activity. Patient was advised that if he/ develops new symptoms prior to surgery to contact our office to arrange a follow-up appointment.  He verbalized understanding."  He was also instructed at that time he could hold Plavix for 5 days prior to surgery.  Preop labs reviewed, WNL.  EKG 05/05/2022: NSR. Rate 66.  Cardiac PET/CT 04/20/2022:   Medium size, moderate defect, that is largely fixed with partial reversibility in the mid to apical anterior,anterolateral segments consistent with prior infarction in the LAD distribution with likely peri-infarct ischemia. No other findings to suggest ischemia. Global MBF is normal event in the LAD distribution (can be seen with small infarcts). Normal LVEF response to stress. Normal LV function. Overall, this is a low risk study given the patient's prior history of MI.   LV perfusion is abnormal. There is evidence of infarction. Defect 1: There is a medium defect with moderate reduction in uptake present in the apical to mid anterior and anterolateral location(s) that is partially reversible. There is abnormal wall motion in the defect area. Consistent with infarction and peri-infarct ischemia. The defect is consistent with abnormal perfusion in the  LAD territory.   Rest left ventricular function is normal. Rest EF: 56 %. Stress left ventricular function is normal. Stress EF: 62 %. End diastolic cavity size is normal. End systolic cavity size is normal.   Myocardial blood flow was computed to be 0.34m/g/min at rest and 2.377mg/min at stress. Global myocardial blood flow reserve was 2.85 and was normal.   Coronary calcium assessment not performed due to prior revascularization.   Findings are consistent with infarction with peri-infarct ischemia. The study is low risk.    Timothy MustyCChildren[facility]/Anesthesiology Phone ([phone]/14/2024 1:31 PM

## 2022-05-05 NOTE — Progress Notes (Signed)
Cardiology Clinic Note   Patient Name: Timothy Hayden Date of Encounter: 05/05/2022  Primary Care Provider:  Libby Maw, MD Primary Cardiologist:  Timothy Grooms, MD  Patient Profile    Timothy Hayden 67 year old male presents the clinic today for follow-up evaluation of his coronary artery disease and preoperative cardiac evaluation.  Past Medical History    Past Medical History:  Diagnosis Date   CAD (coronary artery disease)    a. PCIx1 in 1999 b. abnormal nuc, then cath 01/08/2015 DES to 75% mid LAD, 95% small distal LCx treated medically   Hyperlipidemia    Hypertension    Myocardial infarction ([place]) 03/22/1997   Past Surgical History:  Procedure Laterality Date   CARDIAC CATHETERIZATION N/A 01/08/2015   Procedure: Left Heart Cath and Coronary Angiography;  Surgeon: Timothy Booze, MD;  Location: [facility];  Service: Cardiovascular;  Laterality: N/A;   CARDIAC CATHETERIZATION N/A 01/08/2015   Procedure: Coronary Stent Intervention;  Surgeon: Timothy Booze, MD;  Location: [facility] [facility];  Service: Cardiovascular;  Laterality: N/A;  mid [place] N/A 05/26/2015   Procedure: Left Heart Cath and Coronary Angiography;  Surgeon: Timothy Booze, MD;  Location: [place] [facility];  Service: Cardiovascular;  Laterality: N/A;   CORONARY ANGIOPLASTY WITH STENT PLACEMENT  1999   "1 stent"   ENDOSCOPIC STENT PLACEMENT W/ MLB     KIDNEY SURGERY Right 1971   "had a blood vessel removed off my kidney"    Allergies  No Known Allergies  History of Present Illness    Timothy Hayden has a PMH of coronary artery disease, HLD, HTN, abnormal stress test, and umbilical hernia.  He underwent stenting in [place] in 1999.  His follow-up echocardiogram was abnormal.  A subsequent left heart cath was set up.  He underwent catheterization on 10/16 which showed  95% distal circumflex which was very small and not amenable to PCI medical management was recommended, 75% mid LAD stenosis which was treated with DES.  He retired from [employer].  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  He was seen and evaluated by [facility]FA cardiologist who agreed to fix circumflex in 2017.  He had 2 stents placed which was required for him to fly again.  His ETT 8/18 performed in [place] was normal.  Due to chronic knee pain he is limited in his physical activity.  He tries to avoid walking but has been known to use stationary bike.  He underwent stress testing 11/21 which showed an EF of 49%, upsloping ST segment depression ST segment depression was also noted during stress and V4, 5, V6, V3, 2 3 and aVF leads.  These are returned to baseline after 5-9 minutes of recovery.  No T wave inversion was noted during stress.  He was seen in follow-up by Dr.Varanasi on 04/01/2021.  During that time he denied chest pain nitroglycerin use, palpitations, and shortness of breath.  He had not noticed any low heart rates at home.  He reported that when he would use 100 mg of sildenafil he felt flushed.  He was able to tolerate 80 mg well.  He reported that he felt that his umbilical hernia (present for 10 years), was getting larger.  Follow-up was planned for 1 year.  He presents to the clinic today for follow-up evaluation and preoperative cardiac evaluation.  He states he feels well today.  We reviewed his cardiac PET study.  He expressed understanding.  He reports that since having his metoprolol reduced to 25 mg he feels much better.  He denies chest pain.  We reviewed his upcoming surgery and recommendations for Plavix.  He may hold his Plavix for 5 days prior to his surgery.  We will repeat fasting lipids and LFTs in the next 1-2 weeks.  He wishes to keep his follow-up appointment on the 30th.  I will give him the salty 6 diet instructions.  Today he denies chest pain, shortness of breath,  lower extremity edema, fatigue, palpitations, melena, hematuria, hemoptysis, diaphoresis, weakness, presyncope, syncope, orthopnea, and PND.    Home Medications    Prior to Admission medications   Medication Sig Start Date End Date Taking? Authorizing Provider  atorvastatin (LIPITOR) 80 MG tablet TAKE 1 TABLET BY MOUTH EVERYDAY AT BEDTIME 03/25/22   Timothy Booze, MD  clopidogrel (PLAVIX) 75 MG tablet TAKE 1 TABLET BY MOUTH EVERY DAY 03/25/22   Timothy Booze, MD  metoprolol tartrate (LOPRESSOR) 25 MG tablet TAKE 1 TABLET BY MOUTH TWICE A DAY 03/25/22   Timothy Booze, MD  Multiple Vitamin (MULTI-VITAMINS) TABS Take 1 tablet by mouth daily.     [provider]  nitroGLYCERIN (NITROSTAT) 0.4 MG SL tablet Place 1 tablet (0.4 mg total) under the tongue every 5 (five) minutes as needed for chest pain. 11/14/18   Timothy Booze, MD  Omega-3 Fatty Acids (FISH OIL) 1000 MG CAPS Take 1,000 mg by mouth daily.     [provider]  sildenafil (REVATIO) 20 MG tablet TAKE 3-5 TABLETS BY MOUTH AS NEEDED 1 HOUR PRIOR TO SEXUAL ACTIVITY 11/13/21   Timothy Booze, MD    Family History    Family History  Problem Relation Age of Onset   CAD Mother    He indicated that his mother is deceased. He indicated that his father is deceased. He indicated that his sister is alive. He indicated that his brother is alive.  Social History    Social History   Socioeconomic History   Marital status: Married    Spouse name: Not on file   Number of children: Not on file   Years of education: Not on file   Highest education level: Not on file  Occupational History   Not on file  Tobacco Use   Smoking status: Never   Smokeless tobacco: Never  Vaping Use   Vaping Use: Never used  Substance and Sexual Activity   Alcohol use: No   Drug use: No   Sexual activity: Yes  Other Topics Concern   Not on file  Social History Narrative   Not on file   Social Determinants of  Health   Financial Resource Strain: Low Risk  (09/02/2021)   Overall Financial Resource Strain (CARDIA)    Difficulty of Paying Living Expenses: Not hard at all  Food Insecurity: No Food Insecurity (09/02/2021)   Hunger Vital Sign    Worried About Running Out of Food in the Last Year: Never true    Ran Out of Food in the Last Year: Never true  Transportation Needs: No Transportation Needs (09/02/2021)   PRAPARE - Hydrologist (Medical): No    Lack of Transportation (Non-Medical): No  Physical Activity: Insufficiently Active (09/02/2021)   Exercise Vital Sign    Days of Exercise per Week: 3 days    Minutes of Exercise per Session: 30 min  Stress: No Stress Concern Present (09/02/2021)   [place]    Feeling of Stress : Not at all  Social Connections: Moderately Integrated (09/02/2021)   Social Connection and Isolation Panel [NHANES]    Frequency of Communication with Friends and Family: Three times a week    Frequency of Social Gatherings with Friends and Family: Three times a week    Attends Religious Services: More than 4 times per year    Active Member of Clubs or Organizations: No    Attends Archivist Meetings: Never    Marital Status: Married  Human resources officer Violence: Not At Risk (09/02/2021)   Humiliation, Afraid, Rape, and Kick questionnaire    Fear of Current or Ex-Partner: No    Emotionally Abused: No    Physically Abused: No    Sexually Abused: No     Review of Systems    General:  No chills, fever, night sweats or weight changes.  Cardiovascular:  No chest pain, dyspnea on exertion, edema, orthopnea, palpitations, paroxysmal nocturnal dyspnea. Dermatological: No rash, lesions/masses Respiratory: No cough, dyspnea Urologic: No hematuria, dysuria Abdominal:   No nausea, vomiting, diarrhea, bright red blood per rectum, melena, or hematemesis Neurologic:  No visual  changes, wkns, changes in mental status. All other systems reviewed and are otherwise negative except as noted above.  Physical Exam    VS:  BP 124/60 (BP Location: Left Arm, Patient Position: Sitting, Cuff Size: Normal)   Pulse 66   Ht 5' 9"$  (1.753 m)   Wt 188 lb (85.3 kg)   BMI 27.76 kg/m  , BMI Body mass index is 27.76 kg/m. GEN: Well nourished, well developed, in no acute distress. HEENT: normal. Neck: Supple, no JVD, carotid bruits, or masses. Cardiac: RRR, no murmurs, rubs, or gallops. No clubbing, cyanosis, edema.  Radials/DP/PT 2+ and equal bilaterally.  Respiratory:  Respirations regular and unlabored, clear to auscultation bilaterally. GI: Soft, nontender, nondistended, BS + x 4. MS: no deformity or atrophy. Skin: warm and dry, no rash. Neuro:  Strength and sensation are intact. Psych: Normal affect.  Accessory Clinical Findings    Recent Labs: 05/04/2022: BUN 17; Creatinine, Ser 0.88; Hemoglobin 15.1; Platelets 196; Potassium 4.0; Sodium 136   Recent Lipid Panel    Component Value Date/Time   CHOL 134 04/01/2021 0857   TRIG 75 04/01/2021 0857   HDL 45 04/01/2021 0857   CHOLHDL 3.0 04/01/2021 0857   CHOLHDL 3 06/18/2020 1104   VLDL 24.0 06/18/2020 1104   LDLCALC 74 04/01/2021 0857         ECG personally reviewed by me today-normal sinus rhythm no ectopy 66 bpm- No acute changes  Nuclear stress test 01/31/2020  Nuclear stress EF: 49%. Blood pressure demonstrated a normal response to exercise. Upsloping ST segment depression ST segment depression was noted during stress in the V4, V5, V6, V3, II, III and aVF leads, and returning to baseline after 5-9 minutes of recovery. No T wave inversion was noted during stress. Defect 1: There is a small defect of severe severity present in the mid anterior, mid anterolateral and apical lateral location. Findings consistent with prior myocardial infarction with peri-infarct ischemia. This is an intermediate risk  study. The left ventricular ejection fraction is mildly decreased (45-54%).  Cardiac catheterization 05/26/2015 Prox RCA lesion, 30% stenosed. The RCA is a large dominant vessel which feeds the inferior and significant portion of the lateral territory. Dist Cx lesion, 95% stenosed. This is a  small vessel which is essentially a small branch vessel. The first obtuse marginal which originates before the stenosis, is a much larger vessel and is patent this feed some of the lateral wall. 1st Diag lesion, 50% in-stent restenosis. The lesion was previously treated with a bare metal stent. The left ventricular systolic function is normal. The stent in the LAD was widely patent.   No change in his coronary anatomy since the PCI in 2016. Given his lack of symptoms, would continue medical therapy for the very small vessel disease. The patient will be discharged later today.   Diagnostic Dominance: Right  Intervention   Cardiac PET/CT 04/20/2022    Medium size, moderate defect, that is largely fixed with partial reversibility in the mid to apical anterior,anterolateral segments consistent with prior infarction in the LAD distribution with likely peri-infarct ischemia. No other findings to suggest ischemia. Global MBF is normal event in the LAD distribution (can be seen with small infarcts). Normal LVEF response to stress. Normal LV function. Overall, this is a low risk study given the patient's prior history of MI.   LV perfusion is abnormal. There is evidence of infarction. Defect 1: There is a medium defect with moderate reduction in uptake present in the apical to mid anterior and anterolateral location(s) that is partially reversible. There is abnormal wall motion in the defect area. Consistent with infarction and peri-infarct ischemia. The defect is consistent with abnormal perfusion in the LAD territory.   Rest left ventricular function is normal. Rest EF: 56 %. Stress left ventricular function is  normal. Stress EF: 62 %. End diastolic cavity size is normal. End systolic cavity size is normal.   Myocardial blood flow was computed to be 0.14m/g/min at rest and 2.332mg/min at stress. Global myocardial blood flow reserve was 2.85 and was normal.   Coronary calcium assessment not performed due to prior revascularization.   Findings are consistent with infarction with peri-infarct ischemia. The study is low risk.   Electronically signed by WeEleonore ChiquitoMD  [place].  Coronary artery disease-denies chest pain today.  Denies recent episodes of arm neck and back discomfort.  Underwent stenting in 1999, 2016, 2017.  Details above.  Nuclear stress test 11/21 showed EF of 49%, intermediate risk, and no T wave inversion.  Cardiac PET CT 04/20/2022 reassuring.  Details above. Continue atorvastatin, metoprolol, Plavix, nitroglycerin, omega-3 fatty acids Heart healthy low-sodium diet-salty 6 given Increase physical activity as tolerated  Essential hypertension-BP today 124/60 Continue metoprolol Maintain blood pressure log  Hyperlipidemia-LDL 74 on 04/01/21 Continue atorvastatin, omega-3 fatty acids Heart healthy low-sodium high-fiber diet Increase physical activity as tolerated Repeat fasting lipids and LFTs  Preoperative cardiac evaluation-umbilical hernia repair, [facility], Dr. LoMayo Ao/20/2024     Primary Cardiologist: JaLarae GroomsMD  Chart reviewed as part of pre-operative protocol coverage. Given past medical history and time since last visit, based on ACC/AHA guidelines, TiFERD MUSAould be at acceptable risk for the planned procedure without further cardiovascular testing.   His RCRI is a class IV risk, 11% risk of major cardiac event.  He is able to complete greater than 4 METS of physical activity.  Patient was advised that if he/ develops new symptoms prior to surgery to contact our office to arrange a follow-up appointment.  He verbalized  understanding.  I will route this recommendation to the requesting party via Epic fax function and remove from pre-op pool.  Please call with questions.    Disposition:  Follow-up with Dr.Varanasi in 1 year.   Jossie Ng. Niomi Valent NP-C     05/05/2022, 11:08 AM [place] [address] Office [phone] Fax [phone]    I spent 14 minutes examining this patient, reviewing medications, and using patient centered shared decision making involving her cardiac care.  Prior to her visit I spent greater than 20 minutes reviewing her past medical history,  medications, and prior cardiac tests.

## 2022-05-07 ENCOUNTER — Ambulatory Visit: Payer: Medicare Other | Admitting: Interventional Cardiology

## 2022-05-11 ENCOUNTER — Other Ambulatory Visit: Payer: Self-pay

## 2022-05-11 ENCOUNTER — Ambulatory Visit (HOSPITAL_COMMUNITY): Payer: Medicare Other | Admitting: Physician Assistant

## 2022-05-11 ENCOUNTER — Ambulatory Visit (HOSPITAL_COMMUNITY)
Admission: RE | Admit: 2022-05-11 | Discharge: 2022-05-11 | Disposition: A | Discharge status: Home / Self Care | Payer: Medicare Other | Source: Ambulatory Visit | Attending: Surgery | Admitting: Surgery

## 2022-05-11 ENCOUNTER — Encounter (HOSPITAL_COMMUNITY): Payer: Self-pay | Admitting: Surgery

## 2022-05-11 ENCOUNTER — Ambulatory Visit (HOSPITAL_BASED_OUTPATIENT_CLINIC_OR_DEPARTMENT_OTHER): Payer: Medicare Other | Admitting: Anesthesiology

## 2022-05-11 ENCOUNTER — Encounter (HOSPITAL_COMMUNITY)
Admission: RE | Disposition: A | Discharge status: Home / Self Care | Payer: Self-pay | Source: Ambulatory Visit | Attending: Surgery

## 2022-05-11 DIAGNOSIS — K436 Other and unspecified ventral hernia with obstruction, without gangrene: Secondary | ICD-10-CM

## 2022-05-11 DIAGNOSIS — I2510 Atherosclerotic heart disease of native coronary artery without angina pectoris: Secondary | ICD-10-CM | POA: Diagnosis not present

## 2022-05-11 DIAGNOSIS — I252 Old myocardial infarction: Secondary | ICD-10-CM

## 2022-05-11 DIAGNOSIS — Z955 Presence of coronary angioplasty implant and graft: Secondary | ICD-10-CM | POA: Diagnosis not present

## 2022-05-11 DIAGNOSIS — I10 Essential (primary) hypertension: Secondary | ICD-10-CM | POA: Diagnosis not present

## 2022-05-11 HISTORY — PX: UMBILICAL HERNIA REPAIR: SHX196

## 2022-05-11 SURGERY — REPAIR, HERNIA, UMBILICAL, LAPAROSCOPIC
Anesthesia: General | Site: Abdomen

## 2022-05-11 MED ORDER — LACTATED RINGERS IV SOLN: INTRAVENOUS | Status: DC

## 2022-05-11 MED ORDER — DEXAMETHASONE SODIUM PHOSPHATE 10 MG/ML IJ SOLN
INTRAMUSCULAR | Status: AC
  Filled 2022-05-11: qty 1

## 2022-05-11 MED ORDER — ORAL CARE MOUTH RINSE: 15.0000 mL | Freq: Once | OROMUCOSAL | Status: AC

## 2022-05-11 MED ORDER — BUPIVACAINE HCL (PF) 0.25 % IJ SOLN
INTRAMUSCULAR | Status: AC
  Filled 2022-05-11: qty 30

## 2022-05-11 MED ORDER — ACETAMINOPHEN 500 MG PO TABS: 1000.0000 mg | ORAL_TABLET | Freq: Four times a day (QID) | ORAL | 3 refills | Status: AC | PRN

## 2022-05-11 MED ORDER — CEFAZOLIN SODIUM-DEXTROSE 2-4 GM/100ML-% IV SOLN
2.0000 g | INTRAVENOUS | Status: AC
  Administered 2022-05-11: 2 g via INTRAVENOUS

## 2022-05-11 MED ORDER — ORAL CARE MOUTH RINSE: 15.0000 mL | Freq: Once | OROMUCOSAL | Status: DC

## 2022-05-11 MED ORDER — DOCUSATE SODIUM 100 MG PO CAPS: 100.0000 mg | ORAL_CAPSULE | Freq: Two times a day (BID) | ORAL | 2 refills | Status: DC

## 2022-05-11 MED ORDER — EPHEDRINE SULFATE-NACL 50-0.9 MG/10ML-% IV SOSY
PREFILLED_SYRINGE | INTRAVENOUS | Status: DC | PRN
  Administered 2022-05-11: 5 mg via INTRAVENOUS

## 2022-05-11 MED ORDER — OXYCODONE HCL 5 MG PO TABS: 5.0000 mg | ORAL_TABLET | ORAL | 0 refills | Status: DC | PRN

## 2022-05-11 MED ORDER — MEPERIDINE HCL 25 MG/ML IJ SOLN: 6.2500 mg | INTRAMUSCULAR | Status: DC | PRN

## 2022-05-11 MED ORDER — DEXAMETHASONE SODIUM PHOSPHATE 10 MG/ML IJ SOLN
INTRAMUSCULAR | Status: DC | PRN
  Administered 2022-05-11: 8 mg via INTRAVENOUS

## 2022-05-11 MED ORDER — ROCURONIUM BROMIDE 100 MG/10ML IV SOLN
INTRAVENOUS | Status: DC | PRN
  Administered 2022-05-11: 60 mg via INTRAVENOUS

## 2022-05-11 MED ORDER — FENTANYL CITRATE (PF) 100 MCG/2ML IJ SOLN
INTRAMUSCULAR | Status: DC | PRN
  Administered 2022-05-11 (×3): 50 ug via INTRAVENOUS

## 2022-05-11 MED ORDER — CEFAZOLIN SODIUM-DEXTROSE 2-4 GM/100ML-% IV SOLN
INTRAVENOUS | Status: AC
  Filled 2022-05-11: qty 100

## 2022-05-11 MED ORDER — FENTANYL CITRATE (PF) 100 MCG/2ML IJ SOLN
25.0000 ug | INTRAMUSCULAR | Status: DC | PRN
  Administered 2022-05-11: 25 ug via INTRAVENOUS

## 2022-05-11 MED ORDER — KETOROLAC TROMETHAMINE 30 MG/ML IJ SOLN
INTRAMUSCULAR | Status: AC
  Filled 2022-05-11: qty 1

## 2022-05-11 MED ORDER — FENTANYL CITRATE (PF) 100 MCG/2ML IJ SOLN
INTRAMUSCULAR | Status: AC
  Filled 2022-05-11: qty 2

## 2022-05-11 MED ORDER — ACETAMINOPHEN 160 MG/5ML PO SOLN: 325.0000 mg | ORAL | Status: DC | PRN

## 2022-05-11 MED ORDER — BUPIVACAINE LIPOSOME 1.3 % IJ SUSP: 20.0000 mL | Freq: Once | INTRAMUSCULAR | Status: DC

## 2022-05-11 MED ORDER — METHOCARBAMOL 750 MG PO TABS: 750.0000 mg | ORAL_TABLET | Freq: Four times a day (QID) | ORAL | 1 refills | Status: DC

## 2022-05-11 MED ORDER — ROCURONIUM BROMIDE 10 MG/ML (PF) SYRINGE
PREFILLED_SYRINGE | INTRAVENOUS | Status: AC
  Filled 2022-05-11: qty 10

## 2022-05-11 MED ORDER — LIDOCAINE 2% (20 MG/ML) 5 ML SYRINGE
INTRAMUSCULAR | Status: DC | PRN
  Administered 2022-05-11: 100 mg via INTRAVENOUS

## 2022-05-11 MED ORDER — SUGAMMADEX SODIUM 200 MG/2ML IV SOLN
INTRAVENOUS | Status: DC | PRN
  Administered 2022-05-11: 200 mg via INTRAVENOUS

## 2022-05-11 MED ORDER — HEPARIN SODIUM (PORCINE) 5000 UNIT/ML IJ SOLN: 5000.0000 [IU] | Freq: Once | INTRAMUSCULAR | Status: AC

## 2022-05-11 MED ORDER — OXYCODONE HCL 5 MG/5ML PO SOLN: 5.0000 mg | Freq: Once | ORAL | Status: DC | PRN

## 2022-05-11 MED ORDER — MIDAZOLAM HCL 2 MG/2ML IJ SOLN
INTRAMUSCULAR | Status: AC
  Filled 2022-05-11: qty 2

## 2022-05-11 MED ORDER — ONDANSETRON HCL 4 MG/2ML IJ SOLN
INTRAMUSCULAR | Status: DC | PRN
  Administered 2022-05-11: 4 mg via INTRAVENOUS

## 2022-05-11 MED ORDER — PHENYLEPHRINE HCL-NACL 20-0.9 MG/250ML-% IV SOLN
INTRAVENOUS | Status: DC | PRN
  Administered 2022-05-11: 30 ug/min via INTRAVENOUS

## 2022-05-11 MED ORDER — CHLORHEXIDINE GLUCONATE 0.12 % MT SOLN: 15.0000 mL | Freq: Once | OROMUCOSAL | Status: DC

## 2022-05-11 MED ORDER — PROPOFOL 10 MG/ML IV BOLUS
INTRAVENOUS | Status: DC | PRN
  Administered 2022-05-11: 30 mg via INTRAVENOUS
  Administered 2022-05-11: 200 mg via INTRAVENOUS

## 2022-05-11 MED ORDER — OXYCODONE HCL 5 MG PO TABS: 5.0000 mg | ORAL_TABLET | Freq: Once | ORAL | Status: DC | PRN

## 2022-05-11 MED ORDER — BUPIVACAINE LIPOSOME 1.3 % IJ SUSP
INTRAMUSCULAR | Status: DC | PRN
  Administered 2022-05-11: 20 mL

## 2022-05-11 MED ORDER — BUPIVACAINE LIPOSOME 1.3 % IJ SUSP
INTRAMUSCULAR | Status: AC
  Filled 2022-05-11: qty 20

## 2022-05-11 MED ORDER — ONDANSETRON HCL 4 MG/2ML IJ SOLN
INTRAMUSCULAR | Status: AC
  Filled 2022-05-11: qty 2

## 2022-05-11 MED ORDER — CHLORHEXIDINE GLUCONATE 0.12 % MT SOLN
15.0000 mL | Freq: Once | OROMUCOSAL | Status: AC
  Administered 2022-05-11: 15 mL via OROMUCOSAL

## 2022-05-11 MED ORDER — PHENYLEPHRINE 80 MCG/ML (10ML) SYRINGE FOR IV PUSH (FOR BLOOD PRESSURE SUPPORT)
PREFILLED_SYRINGE | INTRAVENOUS | Status: AC
  Filled 2022-05-11: qty 20

## 2022-05-11 MED ORDER — ACETAMINOPHEN 325 MG PO TABS: 325.0000 mg | ORAL_TABLET | ORAL | Status: DC | PRN

## 2022-05-11 MED ORDER — ONDANSETRON HCL 4 MG/2ML IJ SOLN: 4.0000 mg | Freq: Once | INTRAMUSCULAR | Status: DC | PRN

## 2022-05-11 MED ORDER — HEPARIN SODIUM (PORCINE) 5000 UNIT/ML IJ SOLN
INTRAMUSCULAR | Status: AC
  Administered 2022-05-11: 5000 [IU] via SUBCUTANEOUS
  Filled 2022-05-11: qty 1

## 2022-05-11 MED ORDER — IBUPROFEN 600 MG PO TABS: 600.0000 mg | ORAL_TABLET | Freq: Four times a day (QID) | ORAL | 1 refills | Status: AC | PRN

## 2022-05-11 MED ORDER — FENTANYL CITRATE (PF) 250 MCG/5ML IJ SOLN
INTRAMUSCULAR | Status: AC
  Filled 2022-05-11: qty 5

## 2022-05-11 MED ORDER — CHLORHEXIDINE GLUCONATE CLOTH 2 % EX PADS: 6.0000 | MEDICATED_PAD | Freq: Once | CUTANEOUS | Status: DC

## 2022-05-11 MED ORDER — PROPOFOL 10 MG/ML IV BOLUS
INTRAVENOUS | Status: AC
  Filled 2022-05-11: qty 20

## 2022-05-11 MED ORDER — LIDOCAINE 2% (20 MG/ML) 5 ML SYRINGE
INTRAMUSCULAR | Status: AC
  Filled 2022-05-11: qty 5

## 2022-05-11 MED ORDER — BUPIVACAINE HCL (PF) 0.25 % IJ SOLN
INTRAMUSCULAR | Status: DC | PRN
  Administered 2022-05-11: 20 mL

## 2022-05-11 MED ORDER — 0.9 % SODIUM CHLORIDE (POUR BTL) OPTIME
TOPICAL | Status: DC | PRN
  Administered 2022-05-11: 1000 mL

## 2022-05-11 MED ORDER — KETOROLAC TROMETHAMINE 15 MG/ML IJ SOLN
INTRAMUSCULAR | Status: DC | PRN
  Administered 2022-05-11: 15 mg via INTRAVENOUS

## 2022-05-11 SURGICAL SUPPLY — 49 items
BAG COUNTER SPONGE SURGICOUNT (BAG) ×1 IMPLANT
BINDER ABDOMINAL 12 ML 46-62 (SOFTGOODS) IMPLANT
CANISTER SUCT 3000ML PPV (MISCELLANEOUS) IMPLANT
CATH COUDE FOLEY 5CC 12FR (CATHETERS) IMPLANT
CHLORAPREP W/TINT 26 (MISCELLANEOUS) ×1 IMPLANT
COVER SURGICAL LIGHT HANDLE (MISCELLANEOUS) ×1 IMPLANT
DERMABOND ADVANCED .7 DNX12 (GAUZE/BANDAGES/DRESSINGS) ×1 IMPLANT
DEVICE SECURE STRAP 25 ABSORB (INSTRUMENTS) ×1 IMPLANT
DEVICE TROCAR PUNCTURE CLOSURE (ENDOMECHANICALS) ×1 IMPLANT
DRAPE INCISE IOBAN 66X45 STRL (DRAPES) ×1 IMPLANT
ELECT CAUTERY BLADE 6.4 (BLADE) IMPLANT
ELECT REM PT RETURN 9FT ADLT (ELECTROSURGICAL) ×1
ELECTRODE REM PT RTRN 9FT ADLT (ELECTROSURGICAL) ×1 IMPLANT
GLOVE BIO SURGEON STRL SZ 6.5 (GLOVE) ×1 IMPLANT
GLOVE BIOGEL PI IND STRL 6 (GLOVE) ×1 IMPLANT
GOWN STRL REUS W/ TWL LRG LVL3 (GOWN DISPOSABLE) ×3 IMPLANT
GOWN STRL REUS W/TWL LRG LVL3 (GOWN DISPOSABLE) ×3
GRASPER SUT TROCAR 14GX15 (MISCELLANEOUS) IMPLANT
IRRIG SUCT STRYKERFLOW 2 WTIP (MISCELLANEOUS)
IRRIGATION SUCT STRKRFLW 2 WTP (MISCELLANEOUS) IMPLANT
KIT BASIN OR (CUSTOM PROCEDURE TRAY) ×1 IMPLANT
KIT TURNOVER KIT B (KITS) ×1 IMPLANT
MARKER SKIN DUAL TIP RULER LAB (MISCELLANEOUS) ×1 IMPLANT
MESH VENTRALIGHT ST 4.5IN (Mesh General) IMPLANT
NDL INSUFFLATION 14GA 120MM (NEEDLE) ×1 IMPLANT
NDL SPNL 22GX3.5 QUINCKE BK (NEEDLE) IMPLANT
NEEDLE INSUFFLATION 14GA 120MM (NEEDLE) ×1 IMPLANT
NEEDLE SPNL 22GX3.5 QUINCKE BK (NEEDLE) ×1 IMPLANT
NS IRRIG 1000ML POUR BTL (IV SOLUTION) ×1 IMPLANT
PAD ARMBOARD 7.5X6 YLW CONV (MISCELLANEOUS) ×2 IMPLANT
PENCIL BUTTON HOLSTER BLD 10FT (ELECTRODE) IMPLANT
SCISSORS LAP 5X35 DISP (ENDOMECHANICALS) IMPLANT
SET TUBE SMOKE EVAC HIGH FLOW (TUBING) ×1 IMPLANT
SHEARS HARMONIC ACE PLUS 36CM (ENDOMECHANICALS) IMPLANT
SLEEVE Z-THREAD 5X100MM (TROCAR) ×2 IMPLANT
SUT ETHIBOND CT1 BRD #0 30IN (SUTURE) IMPLANT
SUT MNCRL AB 4-0 PS2 18 (SUTURE) ×1 IMPLANT
SUT NOVA 1 T20/GS 25DT (SUTURE) IMPLANT
SUT NOVA NAB DX-16 0-1 5-0 T12 (SUTURE) ×1 IMPLANT
SUT PDS AB 0 CT1 27 (SUTURE) IMPLANT
TOWEL GREEN STERILE (TOWEL DISPOSABLE) ×1 IMPLANT
TOWEL GREEN STERILE FF (TOWEL DISPOSABLE) ×1 IMPLANT
TRAY FOLEY W/BAG SLVR 16FR (SET/KITS/TRAYS/PACK)
TRAY FOLEY W/BAG SLVR 16FR ST (SET/KITS/TRAYS/PACK) IMPLANT
TRAY LAPAROSCOPIC MC (CUSTOM PROCEDURE TRAY) ×1 IMPLANT
TROCAR 11X100 Z THREAD (TROCAR) IMPLANT
TROCAR BALLN 12MMX100 BLUNT (TROCAR) IMPLANT
TROCAR Z-THREAD OPTICAL 5X100M (TROCAR) ×1 IMPLANT
WATER STERILE IRR 1000ML POUR (IV SOLUTION) ×1 IMPLANT

## 2022-05-11 NOTE — Anesthesia Procedure Notes (Signed)
Procedure Name: Intubation Date/Time: 05/11/2022 3:53 PM  Performed by: Gwyndolyn Saxon, CRNAPre-anesthesia Checklist: Patient identified, Emergency Drugs available, Suction available and Patient being monitored Patient Re-evaluated:Patient Re-evaluated prior to induction Oxygen Delivery Method: Circle system utilized Preoxygenation: Pre-oxygenation with 100% oxygen Induction Type: IV induction Ventilation: Mask ventilation without difficulty Laryngoscope Size: Miller and 2 Grade View: Grade II Tube type: Oral Tube size: 7.5 mm Number of attempts: 1 Airway Equipment and Method: Patient positioned with wedge pillow and Stylet Placement Confirmation: ETT inserted through vocal cords under direct vision, positive ETCO2 and breath sounds checked- equal and bilateral Secured at: 24 cm Tube secured with: Tape Dental Injury: Teeth and Oropharynx as per pre-operative assessment

## 2022-05-11 NOTE — Transfer of Care (Signed)
Immediate Anesthesia Transfer of Care Note  Patient: Timothy Hayden  Procedure(s) Performed: LAPAROSCOPIC UMBILICAL HERNIA WITH MESH (Abdomen)  Patient Location: PACU  Anesthesia Type:General  Level of Consciousness: drowsy and patient cooperative  Airway & Oxygen Therapy: Patient Spontanous Breathing  Post-op Assessment: Report given to RN and Post -op Vital signs reviewed and stable  Post vital signs: Reviewed and stable  Last Vitals:  Vitals Value Taken Time  BP 174/95 05/11/22 1656  Temp    Pulse 82 05/11/22 1658  Resp 22 05/11/22 1658  SpO2 98 % 05/11/22 1658  Vitals shown include unvalidated device data.  Last Pain:  Vitals:   05/11/22 1359  TempSrc:   PainSc: 0-No pain         Complications: No notable events documented.

## 2022-05-11 NOTE — Op Note (Signed)
   Operative Note   Date: 05/11/2022  Procedure: laparoscopic incarcerated ventral hernia repair with mesh  Pre-op diagnosis: incarcerated ventral hernia, 5.1x3.7cm Post-op diagnosis: same  Indication and clinical history: The patient is a 67 y.o. year old male with an incarcerated ventral hernia     Surgeon: Jesusita Oka, MD  Anesthesiologist: Jillyn Hidden, MD Anesthesia: General  Findings:  Specimen: none EBL: <5cc Drains/Implants: 11.4cm circular ventralight mesh  Disposition: PACU - hemodynamically stable.  Description of procedure: The patient was positioned supine on the operating room table. General anesthetic induction and intubation were uneventful. Foley catheter insertion was performed and was atraumatic. Time-out was performed verifying correct patient, procedure, signature of informed consent, and administration of pre-operative antibiotics.   The abdomen was prepped and draped in the usual sterile fashion, including the use of Ioban. The abdomen was entered using an optiview technique with a 66m port in the left upper quadrant. The abdomen was insufflated and inspected, confirming no intra-abdominal injury on entry. Two additional 536mports placed under direct visualization in the left abdomen. Local anesthetic was infiltrated at each of these sites prior to port placement.  The hernia contained incarcerated fat and this was reduced into the abdominal cavity.  The fascia was exposed circumferentially and the fascial defect closed primarily using four #1 Novafil sutures passed percutaneously in an interrupted fashion.  Next the mesh was sutured in 4 quadrants and oriented.  This was inserted into the abdomen and again orientation confirmed.  The mesh was secured to the abdominal wall using the previously placed sutures transfascial using a suture passer.  An absorbable tacker was used to circumferentially secure the remainder of the mesh to the abdominal wall.  The mesh was  flat.  The abdomen was desufflated and the mesh visualized during desufflation and confirmed to remain flat.  All ports were removed the skin was closed using 4-0 Monocryl suture and Dermabond.   Sterile dressings were applied. All sponge and instrument counts were correct at the conclusion of the procedure. The patient was awakened from anesthesia, extubated uneventfully, and transported to the PACU in good condition. There were no complications.    AyJesusita OkaMD General and [facility]

## 2022-05-11 NOTE — H&P (Signed)
   Timothy Hayden is an 67 y.o. male.   HPI: 3M with VH, plan for lap VHR with mesh. The patient has had no hospitalizations, ER visits, surgeries, or newly diagnosed allergies since being seen in the office. Seen by cardiology for risk stratification pre-op.    Past Medical History:  Diagnosis Date   CAD (coronary artery disease)    a. PCIx1 in 1999 b. abnormal nuc, then cath 01/08/2015 DES to 75% mid LAD, 95% small distal LCx treated medically   Hyperlipidemia    Hypertension    Myocardial infarction ([place]) 03/22/1997    Past Surgical History:  Procedure Laterality Date   CARDIAC CATHETERIZATION N/A 01/08/2015   Procedure: Left Heart Cath and Coronary Angiography;  Surgeon: Jettie Booze, MD;  Location: [place] [facility];  Service: Cardiovascular;  Laterality: N/A;   CARDIAC CATHETERIZATION N/A 01/08/2015   Procedure: Coronary Stent Intervention;  Surgeon: Jettie Booze, MD;  Location: [place] [facility];  Service: Cardiovascular;  Laterality: N/A;  mid [place] N/A 05/26/2015   Procedure: Left Heart Cath and Coronary Angiography;  Surgeon: Jettie Booze, MD;  Location: [place] [facility];  Service: Cardiovascular;  Laterality: N/A;   CORONARY ANGIOPLASTY WITH STENT PLACEMENT  1999   "1 stent"   ENDOSCOPIC STENT PLACEMENT W/ MLB     KIDNEY SURGERY Right 1971   "had a blood vessel removed off my kidney"    Family History  Problem Relation Age of Onset   CAD Mother     Social History:  reports that he has never smoked. He has never used smokeless tobacco. He reports that he does not drink alcohol and does not use drugs.  Allergies: No Known Allergies  Medications: I have reviewed the patient's current medications.  No results found for this or any previous visit (from the past 48 hour(s)).  No results found.  ROS 10 point review of systems is negative except as listed above in HPI.    Physical Exam Blood pressure (!) 150/82, pulse 67, temperature 98 F (36.7 C), temperature source Oral, resp. rate 18, height 5' 9"$  (1.753 m), weight 82.1 kg, SpO2 99 %. Constitutional: well-developed, well-nourished HEENT: pupils equal, round, reactive to light, 69m b/l, moist conjunctiva, external inspection of ears and nose normal, hearing intact Oropharynx: normal oropharyngeal mucosa, normal dentition Neck: no thyromegaly, trachea midline, no midline cervical tenderness to palpation Chest: breath sounds equal bilaterally, normal respiratory effort, no midline or lateral chest wall tenderness to palpation/deformity Abdomen: soft, NT, no bruising, no hepatosplenomegaly GU: no blood at urethral meatus of penis, no scrotal masses or abnormality  Back: no wounds, no thoracic/lumbar spine tenderness to palpation, no thoracic/lumbar spine stepoffs Rectal: deferred3 Extremities: 2+ radial and pedal pulses bilaterally, intact motor and sensation bilateral UE and LE, no peripheral edema MSK: normal gait/station, no clubbing/cyanosis of fingers/toes, normal ROM of all four extremities Skin: warm, dry, no rashes Psych: normal memory, normal mood/affect     Assessment/Plan: 3M with VH, plan for lap VHR with mesh. Informed consent was obtained after detailed explanation of risks, including bleeding, infection, hematoma/seroma, temporary or permanent neuropathy, hernia recurrence, and mesh infection requiring explantation. All questions answered to the patient's satisfaction.   AJesusita Oka MD General and [facility]

## 2022-05-11 NOTE — Anesthesia Postprocedure Evaluation (Signed)
Anesthesia Post Note  Patient: Timothy Hayden  Procedure(s) Performed: LAPAROSCOPIC UMBILICAL HERNIA WITH MESH (Abdomen)     Patient location during evaluation: PACU Anesthesia Type: General Level of consciousness: awake and alert Pain management: pain level controlled Vital Signs Assessment: post-procedure vital signs reviewed and stable Respiratory status: spontaneous breathing, nonlabored ventilation, respiratory function stable and patient connected to nasal cannula oxygen Cardiovascular status: blood pressure returned to baseline and stable Postop Assessment: no apparent nausea or vomiting Anesthetic complications: no   No notable events documented.  Last Vitals:  Vitals:   05/11/22 1353  BP: (!) 150/82  Pulse: 67  Resp: 18  Temp: 36.7 C  SpO2: 99%    Last Pain:  Vitals:   05/11/22 1359  TempSrc:   PainSc: 0-No pain                 Netasha Wehrli

## 2022-05-11 NOTE — Discharge Instructions (Addendum)
[facility]  LAPAROSCOPIC SURGERY: POST OP INSTRUCTIONS Always review your discharge instruction sheet given to you by the facility where your surgery was performed. IF YOU HAVE DISABILITY OR FAMILY LEAVE FORMS, YOU MUST BRING THEM TO THE OFFICE FOR PROCESSING.   DO NOT GIVE THEM TO YOUR DOCTOR.  PAIN CONTROL  Pain regimen: take over-the-counter tylenol (acetaminophen) 1059m every six hours, the prescription ibuprofen (6043m every six hours and the robaxin (methocarbamol) 7509mvery six hours. With all three of these, you should be taking something every two hours. Example: tylenol ( acetaminophen) at 8am, ibuprofen at 10am, robaxin (methocarbamol) at 12pm, tylenol (acetaminophen) again at 2pm, ibuprofen again at 4pm, robaxin (methocarbamol) at 6pm. You also have a prescription for oxycodone, which should be taken if the tylenol (acetaminophen), ibuprofen, and robaxin (methocarbamol) are not enough to control your pain. You may take the oxycodone as frequently as every four hours as needed, but if you are taking the other medications as above, you should not need the oxycodone this frequently. You have also been given a prescription for colace (docusate) which is a stool softener. Please take this as prescribed because the oxycodone can cause constipation and the colace (docusate) will minimize or prevent constipation. Do not drive while taking or under the influence of the oxycodone as it is a narcotic medication. Use ice packs to help control pain. If you need a refill on your pain medication, please contact your pharmacy.  They will contact our office to request authorization. Prescriptions will not be filled after 5pm or on week-ends.  HOME MEDICATIONS Take your usually prescribed medications unless otherwise directed.  DIET You should follow a light diet the first few days after arrival home.  Be sure to include lots of fluids daily.   CONSTIPATION It is common to  experience some constipation after surgery and if you are taking pain medication.  Increasing fluid intake and taking a stool softener (such as Colace) will usually help or prevent this problem from occurring.  A mild laxative (Milk of Magnesia or Miralax) should be taken according to package instructions if there are no bowel movements after 48 hours.  WOUND/INCISION CARE Most patients will experience some swelling and bruising in the area of the incisions.  Ice packs will help.  Swelling and bruising can take several days to resolve.  May shower beginning 05/12/2022.  Do not peel off or scrub skin glue. May allow warm soapy water to run over incision, then rinse and pat dry.  Do not soak in any water (tubs, hot tubs, pools, lakes, oceans) for one week.   ACTIVITIES You may resume regular (light) daily activities beginning the next day--such as daily self-care, walking, climbing stairs--gradually increasing activities as tolerated.  You may have sexual intercourse when it is comfortable.   No lifting greater than 5 pounds for six weeks.  You may drive when you are no longer taking narcotic pain medication, you can comfortably wear a seatbelt, and you can safely maneuver your car and apply brakes.  FOLLOW-UP You should see your doctor in the office for a follow-up appointment approximately 2-3 weeks after your surgery.  You should have been given your post-op/follow-up appointment when your surgery was scheduled.  If you did not receive a post-op/follow-up appointment, make sure that you call for this appointment within a day or two after you arrive home to insure a convenient appointment time.  WHEN TO CALL YOUR DOCTOR: Fever over 101.5 Inability to urinate  Continued bleeding from incision. Increased pain, redness, or drainage from the incision. Increasing abdominal pain  The clinic staff is available to answer your questions during regular business hours.  Please don't hesitate to call and  ask to speak to one of the nurses for clinical concerns.  If you have a medical emergency, go to the nearest emergency room or call 911.  A surgeon from [facility] is always on call at the hospital. [address] [place], [place], [place]  [address] ? [address], [place], [place]   [address] [phone] ? [phone] ? FAX [phone] Web site: [email or web address]

## 2022-05-12 ENCOUNTER — Encounter (HOSPITAL_COMMUNITY): Payer: Self-pay | Admitting: Surgery

## 2022-05-14 LAB — HEPATIC FUNCTION PANEL
ALT: 14 IU/L (ref 0–44)
AST: 14 IU/L (ref 0–40)
Albumin: 4.4 g/dL (ref 3.9–4.9)
Alkaline Phosphatase: 84 IU/L (ref 44–121)
Bilirubin Total: 0.4 mg/dL (ref 0.0–1.2)
Bilirubin, Direct: 0.14 mg/dL (ref 0.00–0.40)
Total Protein: 6.6 g/dL (ref 6.0–8.5)

## 2022-05-14 LAB — LIPID PANEL
Chol/HDL Ratio: 2.5 ratio (ref 0.0–5.0)
Cholesterol, Total: 131 mg/dL (ref 100–199)
HDL: 52 mg/dL (ref >39)
LDL Chol Calc (NIH): 57 mg/dL (ref 0–99)
Triglycerides: 122 mg/dL (ref 0–149)
VLDL Cholesterol Cal: 22 mg/dL (ref 5–40)

## 2022-05-16 NOTE — Progress Notes (Unsigned)
Cardiology Office Note   Date:  05/17/2022   ID:  [name], [name] [birth year], MRN [id number]  PCP:  Libby Maw, MD    No chief complaint on file.  CAD  Wt Readings from Last 3 Encounters:  05/17/22 188 lb 3.2 oz (85.4 kg)  05/11/22 181 lb (82.1 kg)  05/05/22 188 lb (85.3 kg)       History of Present Illness: Timothy Hayden is a 67 y.o. male  with a hx of CAD status post stenting in [place], [place] in 1999.  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 This was abnormal and cardiac catheterization was arranged. LHC 01/08/15 demonstrated 95% distal LCx which was very small and treated medically, 75% mid LAD stenosis which was treated with a Synergy DES.    He has done well since the PCI to the LAD. He denies any chest discomfort or shortness of breath. He is exercising regularly without any symptoms. Due to [employer] regulations, he had a repeat cardiac testing.  It was unchanged.  LAD stent was widely patent.     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     ETT in 8/18 in [place] and it was normal.    He has chronic knee pain that limit his exercise.  He tries to avoid walking but will try a stationary bike.     He has retired from [employer].    11/21 nuclear stress test showed: "Nuclear stress EF: 49%. Blood pressure demonstrated a normal response to exercise. Upsloping ST segment depression ST segment depression was noted during stress in the V4, V5, V6, V3, II, III and aVF leads, and returning to baseline after 5-9 minutes of recovery. No T wave inversion was noted during stress. Defect 1: There is a small defect of severe severity present in the mid anterior, mid anterolateral and apical lateral location. Findings consistent with prior myocardial infarction with peri-infarct ischemia. This is an intermediate risk study. The left ventricular  ejection fraction is mildly decreased (45-54%)." Went to [employer] in 02/2021.  He did well walking.  In 2023: "He has not noticed any slow heart rates at home.  When he tries 100 mg sildenafil, he feels flushed.  He typically sticks to 80 mg dose. "    Felt better with metoprolol reduced to 25 mg.  PET/CT in 2024 showed: "Medium size, moderate defect, that is largely fixed with partial reversibility in the mid to apical anterior,anterolateral segments consistent with prior infarction in the LAD distribution with likely peri-infarct ischemia. No other findings to suggest ischemia. Global MBF is normal event in the LAD distribution (can be seen with small infarcts). Normal LVEF response to stress. Normal LV function. Overall, this is a low risk study given the patient's prior history of MI.   LV perfusion is abnormal. There is evidence of infarction. Defect 1: There is a medium defect with moderate reduction in uptake present in the apical to mid anterior and anterolateral location(s) that is partially reversible. There is abnormal wall motion in the defect area. Consistent with infarction and peri-infarct ischemia. The defect is consistent with abnormal perfusion in the LAD territory.   Rest left ventricular function is normal. Rest EF: 56 %. Stress left ventricular function is normal. Stress EF: 62 %. End diastolic cavity size is normal. End systolic cavity size  is normal.   Myocardial blood flow was computed to be 0.67m/g/min at rest and 2.322mg/min at stress. Global myocardial blood flow reserve was 2.85 and was normal.   Coronary calcium assessment not performed due to prior revascularization.   Findings are consistent with infarction with peri-infarct ischemia. The study is low risk."  [facility]  No heart related issues during the surgery.  Denies : Chest pain. Dizziness. Leg edema. Nitroglycerin use. Orthopnea. Palpitations. Paroxysmal nocturnal dyspnea.  Shortness of breath. Syncope.    Exercising limited after hernia surgery.     Past Medical History:  Diagnosis Date   CAD (coronary artery disease)    a. PCIx1 in 1999 b. abnormal nuc, then cath 01/08/2015 DES to 75% mid LAD, 95% small distal LCx treated medically   Hyperlipidemia    Hypertension    [place]    Past Surgical History:  Procedure Laterality Date   CARDIAC CATHETERIZATION N/A 01/08/2015   Procedure: Left Heart Cath and Coronary Angiography;  Surgeon: JaJettie BoozeMD;  [facility];  Service: Cardiovascular;  Laterality: N/A;   CARDIAC CATHETERIZATION N/A 01/08/2015   Procedure: Coronary Stent Intervention;  Surgeon: JaJettie BoozeMD;  [facility];  Service: Cardiovascular;  Laterality: N/A;  [place] 05/26/2015   Procedure: Left Heart Cath and Coronary Angiography;  Surgeon: JaJettie BoozeMD;  [facility];  Service: Cardiovascular;  Laterality: N/A;   CORONARY ANGIOPLASTY WITH STENT PLACEMENT  1999   "1 stent"   ENDOSCOPIC STENT PLACEMENT W/ MLB     KIDNEY SURGERY Right 1971   "had a blood vessel removed off my kidney"   UMBILICAL HERNIA REPAIR N/A 05/11/2022   Procedure: LAPAROSCOPIC UMBILICAL HERNIA WITH MESH;  Surgeon: LoJesusita OkaMD;  Location: [facility];  Service: General;  Laterality: N/A;     Current Outpatient Medications  Medication Sig Dispense Refill   acetaminophen (TYLENOL) 500 MG tablet Take 2 tablets (1,000 mg total) by mouth every 6 (six) hours as needed. 120 tablet 3   atorvastatin (LIPITOR) 80 MG tablet TAKE 1 TABLET BY MOUTH EVERYDAY AT BEDTIME 90 tablet 1   clopidogrel (PLAVIX) 75 MG tablet TAKE 1 TABLET BY MOUTH EVERY DAY 90 tablet 1   docusate sodium (COLACE) 100 MG capsule Take 1 capsule (100 mg total) by mouth 2 (two) times daily. 60 capsule 2   ibuprofen (ADVIL)  600 MG tablet Take 1 tablet (600 mg total) by mouth every 6 (six) hours as needed. 120 tablet 1   methocarbamol (ROBAXIN-750) 750 MG tablet Take 1 tablet (750 mg total) by mouth 4 (four) times daily. 120 tablet 1   metoprolol tartrate (LOPRESSOR) 25 MG tablet TAKE 1 TABLET BY MOUTH TWICE A DAY 180 tablet 1   Multiple Vitamin (MULTI-VITAMINS) TABS Take 1 tablet by mouth daily.      nitroGLYCERIN (NITROSTAT) 0.4 MG SL tablet Place 1 tablet (0.4 mg total) under the tongue every 5 (five) minutes as needed for chest pain. 75 tablet 2   Omega-3 Fatty Acids (FISH OIL) 1000 MG CAPS Take 1,000 mg by mouth daily.      oxyCODONE (ROXICODONE) 5 MG immediate release tablet Take 1 tablet (5 mg total) by mouth every 4 (four) hours as needed. 20 tablet 0   sildenafil (REVATIO) 20 MG tablet TAKE 3-5 TABLETS BY MOUTH AS  NEEDED 1 HOUR PRIOR TO SEXUAL ACTIVITY 30 tablet 3   No current facility-administered medications for this visit.    Allergies:   Patient has no known allergies.    Social History:  The patient  reports that he has never smoked. He has never used smokeless tobacco. He reports that he does not drink alcohol and does not use drugs.   Family History:  The patient's family history includes CAD in his mother.    ROS:  Please see the history of present illness.   Otherwise, review of systems are positive for surgical pain.   All other systems are reviewed and negative.    PHYSICAL EXAM: VS:  BP 124/64   Pulse 61   Ht '5\' 9"'$  (1.753 m)   Wt 188 lb 3.2 oz (85.4 kg)   SpO2 96%   BMI 27.79 kg/m  , BMI Body mass index is 27.79 kg/m. GEN: Well nourished, well developed, in no acute distress HEENT: normal Neck: no JVD, carotid bruits, or masses Cardiac: RRR; no murmurs, rubs, or gallops,no edema  Respiratory:  clear to auscultation bilaterally, normal work of breathing GI: soft, nontender, nondistended, + BS; severe lower stomach bruising MS: no deformity or atrophy Skin: warm and dry, no  rash Neuro:  Strength and sensation are intact Psych: euthymic mood, full affect   EKG:   The ekg ordered 05/04/22 demonstrates NSR, no ST changes   Recent Labs: 05/04/2022: BUN 17; Creatinine, Ser 0.88; Hemoglobin 15.1; Platelets 196; Potassium 4.0; Sodium 136 05/13/2022: ALT 14   Lipid Panel    Component Value Date/Time   CHOL 131 05/13/2022 0930   TRIG 122 05/13/2022 0930   HDL 52 05/13/2022 0930   CHOLHDL 2.5 05/13/2022 0930   CHOLHDL 3 06/18/2020 1104   VLDL 24.0 06/18/2020 1104   LDLCALC 57 05/13/2022 0930     Other studies Reviewed: Additional studies/ records that were reviewed today with results demonstrating: labs reviewed.   ASSESSMENT AND PLAN:  CAD/Old MI: No angina on medical therapy.  Reassuring PET CT stress test.   Hyperlipidemia: February 2024 total cholesterol 131 triglycerides 122 HDL 52 LDL 57.  Continue atorvastatin along with healthy,  avoid processed foods.  Continue High-fiber diet.. Erectile dysfunction:  Used sildenafil in the past.    Current medicines are reviewed at length with the patient today.  The patient concerns regarding his medicines were addressed.  The following changes have been made:  No change  Labs/ tests ordered today include:  No orders of the defined types were placed in this encounter.   Recommend 150 minutes/week of aerobic exercise Low fat, low carb, high fiber diet recommended  Disposition:   FU in 1 year   Signed, Larae Grooms, MD  05/17/2022 10:32 AM    [facility] [address], [place], [place]  [address] Phone: [phone]; Fax: [phone]

## 2022-05-17 ENCOUNTER — Encounter: Payer: Self-pay | Admitting: Interventional Cardiology

## 2022-05-17 ENCOUNTER — Ambulatory Visit: Payer: Medicare Other | Attending: Interventional Cardiology | Admitting: Interventional Cardiology

## 2022-05-17 VITALS — BP 124/64 | HR 61 | Ht 69.0 in | Wt 188.2 lb

## 2022-05-17 DIAGNOSIS — I10 Essential (primary) hypertension: Secondary | ICD-10-CM

## 2022-05-17 DIAGNOSIS — I252 Old myocardial infarction: Secondary | ICD-10-CM | POA: Diagnosis present

## 2022-05-17 DIAGNOSIS — I25118 Atherosclerotic heart disease of native coronary artery with other forms of angina pectoris: Secondary | ICD-10-CM | POA: Insufficient documentation

## 2022-05-17 DIAGNOSIS — E782 Mixed hyperlipidemia: Secondary | ICD-10-CM | POA: Diagnosis present

## 2022-05-17 DIAGNOSIS — I2510 Atherosclerotic heart disease of native coronary artery without angina pectoris: Secondary | ICD-10-CM | POA: Diagnosis not present

## 2022-05-17 NOTE — Patient Instructions (Signed)

## 2022-09-06 ENCOUNTER — Ambulatory Visit (INDEPENDENT_AMBULATORY_CARE_PROVIDER_SITE_OTHER): Payer: Medicare Other

## 2022-09-06 VITALS — Ht 69.0 in | Wt 187.0 lb

## 2022-09-06 DIAGNOSIS — Z0000 Encounter for general adult medical examination without abnormal findings: Secondary | ICD-10-CM | POA: Diagnosis not present

## 2022-09-06 NOTE — Patient Instructions (Signed)
[name] , Thank you for taking time to come for your Medicare Wellness Visit. I appreciate your ongoing commitment to your health goals. Please review the following plan we discussed and let me know if I can assist you in the future.   These are the goals we discussed:  Goals      Patient Stated     Drink more water & less soda & lose some weight     Patient Stated     09/06/2022, wants to weigh 176 pounds        This is a list of the screening recommended for you and due dates:  Health Maintenance  Topic Date Due   Hepatitis C Screening  Never done   Zoster (Shingles) Vaccine (1 of 2) Never done   Pneumonia Vaccine (2 of 2 - PCV) 06/18/2021   COVID-19 Vaccine (4 - 2023-24 season) 11/20/2021   Flu Shot  10/21/2022   Medicare Annual Wellness Visit  09/06/2023   DTaP/Tdap/Td vaccine (5 - Td or Tdap) 08/11/2025   Colon Cancer Screening  11/03/2028   HPV Vaccine  Aged Out    Advanced directives: Please bring a copy of your POA (Power of [place]) and/or Living Will to your next appointment.   Conditions/risks identified: none  Next appointment: Follow up in one year for your annual wellness visit.   Preventive Care 68 Years and Older, Male  Preventive care refers to lifestyle choices and visits with your health care provider that can promote health and wellness. What does preventive care include? A yearly physical exam. This is also called an annual well check. Dental exams once or twice a year. Routine eye exams. Ask your health care provider how often you should have your eyes checked. Personal lifestyle choices, including: Daily care of your teeth and gums. Regular physical activity. Eating a healthy diet. Avoiding tobacco and drug use. Limiting alcohol use. Practicing safe sex. Taking low doses of aspirin every day. Taking vitamin and mineral supplements as recommended by your health care provider. What happens during an annual well check? The services and  screenings done by your health care provider during your annual well check will depend on your age, overall health, lifestyle risk factors, and family history of disease. Counseling  Your health care provider may ask you questions about your: Alcohol use. Tobacco use. Drug use. Emotional well-being. Home and relationship well-being. Sexual activity. Eating habits. History of falls. Memory and ability to understand (cognition). Work and work Astronomer. Screening  You may have the following tests or measurements: Height, weight, and BMI. Blood pressure. Lipid and cholesterol levels. These may be checked every 5 years, or more frequently if you are over 68 years old. Skin check. Lung cancer screening. You may have this screening every year starting at age 68 if you have a 30-pack-year history of smoking and currently smoke or have quit within the past 15 years. Fecal occult blood test (FOBT) of the stool. You may have this test every year starting at age 68. Flexible sigmoidoscopy or colonoscopy. You may have a sigmoidoscopy every 5 years or a colonoscopy every 10 years starting at age 68. Prostate cancer screening. Recommendations will vary depending on your family history and other risks. Hepatitis C blood test. Hepatitis B blood test. Sexually transmitted disease (STD) testing. Diabetes screening. This is done by checking your blood sugar (glucose) after you have not eaten for a while (fasting). You may have this done every 1-3 years. Abdominal aortic aneurysm (AAA)  screening. You may need this if you are a current or former smoker. Osteoporosis. You may be screened starting at age 68 if you are at high risk. Talk with your health care provider about your test results, treatment options, and if necessary, the need for more tests. Vaccines  Your health care provider may recommend certain vaccines, such as: Influenza vaccine. This is recommended every year. Tetanus, diphtheria, and  acellular pertussis (Tdap, Td) vaccine. You may need a Td booster every 10 years. Zoster vaccine. You may need this after age 68. Pneumococcal 13-valent conjugate (PCV13) vaccine. One dose is recommended after age 68. Pneumococcal polysaccharide (PPSV23) vaccine. One dose is recommended after age 68. Talk to your health care provider about which screenings and vaccines you need and how often you need them. This information is not intended to replace advice given to you by your health care provider. Make sure you discuss any questions you have with your health care provider. Document Released: 04/04/2015 Document Revised: 11/26/2015 Document Reviewed: 01/07/2015 Elsevier Interactive Patient Education  2017 [employer] Prevention in the Home Falls can cause injuries. They can happen to people of all ages. There are many things you can do to make your home safe and to help prevent falls. What can I do on the outside of my home? Regularly fix the edges of walkways and driveways and fix any cracks. Remove anything that might make you trip as you walk through a door, such as a raised step or threshold. Trim any bushes or trees on the path to your home. Use bright outdoor lighting. Clear any walking paths of anything that might make someone trip, such as rocks or tools. Regularly check to see if handrails are loose or broken. Make sure that both sides of any steps have handrails. Any raised decks and porches should have guardrails on the edges. Have any leaves, snow, or ice cleared regularly. Use sand or salt on walking paths during winter. Clean up any spills in your garage right away. This includes oil or grease spills. What can I do in the bathroom? Use night lights. Install grab bars by the toilet and in the tub and shower. Do not use towel bars as grab bars. Use non-skid mats or decals in the tub or shower. If you need to sit down in the shower, use a plastic, non-slip stool. Keep  the floor dry. Clean up any water that spills on the floor as soon as it happens. Remove soap buildup in the tub or shower regularly. Attach bath mats securely with double-sided non-slip rug tape. Do not have throw rugs and other things on the floor that can make you trip. What can I do in the bedroom? Use night lights. Make sure that you have a light by your bed that is easy to reach. Do not use any sheets or blankets that are too big for your bed. They should not hang down onto the floor. Have a firm chair that has side arms. You can use this for support while you get dressed. Do not have throw rugs and other things on the floor that can make you trip. What can I do in the kitchen? Clean up any spills right away. Avoid walking on wet floors. Keep items that you use a lot in easy-to-reach places. If you need to reach something above you, use a strong step stool that has a grab bar. Keep electrical cords out of the way. Do not use floor polish or  wax that makes floors slippery. If you must use wax, use non-skid floor wax. Do not have throw rugs and other things on the floor that can make you trip. What can I do with my stairs? Do not leave any items on the stairs. Make sure that there are handrails on both sides of the stairs and use them. Fix handrails that are broken or loose. Make sure that handrails are as long as the stairways. Check any carpeting to make sure that it is firmly attached to the stairs. Fix any carpet that is loose or worn. Avoid having throw rugs at the top or bottom of the stairs. If you do have throw rugs, attach them to the floor with carpet tape. Make sure that you have a light switch at the top of the stairs and the bottom of the stairs. If you do not have them, ask someone to add them for you. What else can I do to help prevent falls? Wear shoes that: Do not have high heels. Have rubber bottoms. Are comfortable and fit you well. Are closed at the toe. Do not  wear sandals. If you use a stepladder: Make sure that it is fully opened. Do not climb a closed stepladder. Make sure that both sides of the stepladder are locked into place. Ask someone to hold it for you, if possible. Clearly mark and make sure that you can see: Any grab bars or handrails. First and last steps. Where the edge of each step is. Use tools that help you move around (mobility aids) if they are needed. These include: Canes. Walkers. Scooters. Crutches. Turn on the lights when you go into a dark area. Replace any light bulbs as soon as they burn out. Set up your furniture so you have a clear path. Avoid moving your furniture around. If any of your floors are uneven, fix them. If there are any pets around you, be aware of where they are. Review your medicines with your doctor. Some medicines can make you feel dizzy. This can increase your chance of falling. Ask your doctor what other things that you can do to help prevent falls. This information is not intended to replace advice given to you by your health care provider. Make sure you discuss any questions you have with your health care provider. Document Released: 01/02/2009 Document Revised: 08/14/2015 Document Reviewed: 04/12/2014 Elsevier Interactive Patient Education  2017 [employer].

## 2022-09-06 NOTE — Progress Notes (Signed)
I connected with  Timothy Hayden on 09/06/22 by a audio enabled telemedicine application and verified that I am speaking with the correct person using two identifiers.  Patient Location: Home  Provider Location: Office/Clinic  I discussed the limitations of evaluation and management by telemedicine. The patient expressed understanding and agreed to proceed. Subjective:   Timothy Hayden is a 67 y.o. male who presents for Medicare Annual/Subsequent preventive examination.  Patient Medicare AWV questionnaire was completed by the patient on 09/01/2022; I have confirmed that all information answered by patient is correct and no changes since this date.    Review of Systems     Cardiac Risk Factors include: advanced age (>86men, >45 women);dyslipidemia;hypertension;male gender     Objective:    Today's Vitals   09/06/22 1356  Weight: 187 lb (84.8 kg)  Height: 5\' 9"  (1.753 m)   Body mass index is 27.62 kg/m.     09/06/2022    2:01 PM 05/04/2022    9:22 AM 09/02/2021    8:39 AM 08/12/2020    3:00 PM 05/26/2015    6:19 AM 01/08/2015    7:06 AM  Advanced Directives  Does Patient Have a Medical Advance Directive? Yes Yes Yes Yes Yes No  Type of Estate agent of [place];Living will Healthcare Power of [place];Living will Healthcare Power of [place];Living will Healthcare Power of [place];Living will Healthcare Power of [place];Living will   Does patient want to make changes to medical advance directive?     No - Patient declined   Copy of Healthcare Power of Attorney in Chart? No - copy requested No - copy requested No - copy requested No - copy requested No - copy requested   Would patient like information on creating a medical advance directive?      No - patient declined information    Current Medications (verified) Outpatient Encounter Medications as of 09/06/2022  Medication Sig   acetaminophen (TYLENOL) 500 MG tablet Take 2 tablets (1,000 mg total) by  mouth every 6 (six) hours as needed.   atorvastatin (LIPITOR) 80 MG tablet TAKE 1 TABLET BY MOUTH EVERYDAY AT BEDTIME   clopidogrel (PLAVIX) 75 MG tablet TAKE 1 TABLET BY MOUTH EVERY DAY   finasteride (PROSCAR) 5 MG tablet Take 5 mg by mouth daily.   ibuprofen (ADVIL) 600 MG tablet Take 1 tablet (600 mg total) by mouth every 6 (six) hours as needed.   metoprolol tartrate (LOPRESSOR) 25 MG tablet TAKE 1 TABLET BY MOUTH TWICE A DAY   Multiple Vitamin (MULTI-VITAMINS) TABS Take 1 tablet by mouth daily.    nitroGLYCERIN (NITROSTAT) 0.4 MG SL tablet Place 1 tablet (0.4 mg total) under the tongue every 5 (five) minutes as needed for chest pain.   Omega-3 Fatty Acids (FISH OIL) 1000 MG CAPS Take 1,000 mg by mouth daily.    sildenafil (REVATIO) 20 MG tablet TAKE 3-5 TABLETS BY MOUTH AS NEEDED 1 HOUR PRIOR TO SEXUAL ACTIVITY   tamsulosin (FLOMAX) 0.4 MG CAPS capsule Take 0.4 mg by mouth daily.   docusate sodium (COLACE) 100 MG capsule Take 1 capsule (100 mg total) by mouth 2 (two) times daily.   methocarbamol (ROBAXIN-750) 750 MG tablet Take 1 tablet (750 mg total) by mouth 4 (four) times daily.   oxyCODONE (ROXICODONE) 5 MG immediate release tablet Take 1 tablet (5 mg total) by mouth every 4 (four) hours as needed.   No facility-administered encounter medications on file as of 09/06/2022.    Allergies (verified) Patient  has no known allergies.   History: Past Medical History:  Diagnosis Date   CAD (coronary artery disease)    a. PCIx1 in 1999 b. abnormal nuc, then cath 01/08/2015 DES to 75% mid LAD, 95% small distal LCx treated medically   Hyperlipidemia    Hypertension    Myocardial infarction (HCC) 03/22/1997   Past Surgical History:  Procedure Laterality Date   CARDIAC CATHETERIZATION N/A 01/08/2015   Procedure: Left Heart Cath and Coronary Angiography;  Surgeon: Corky Crafts, MD;  Location: [facility] INVASIVE CV LAB;  Service: Cardiovascular;  Laterality: N/A;   CARDIAC CATHETERIZATION  N/A 01/08/2015   Procedure: Coronary Stent Intervention;  Surgeon: Corky Crafts, MD;  Location: [facility] INVASIVE CV LAB;  Service: Cardiovascular;  Laterality: N/A;  mid lad 2.25x32 synergy   CARDIAC CATHETERIZATION  1999   CARDIAC CATHETERIZATION N/A 05/26/2015   Procedure: Left Heart Cath and Coronary Angiography;  Surgeon: Corky Crafts, MD;  Location: [facility] INVASIVE CV LAB;  Service: Cardiovascular;  Laterality: N/A;   CORONARY ANGIOPLASTY WITH STENT PLACEMENT  1999   "1 stent"   ENDOSCOPIC STENT PLACEMENT W/ MLB     KIDNEY SURGERY Right 1971   "had a blood vessel removed off my kidney"   UMBILICAL HERNIA REPAIR N/A 05/11/2022   Procedure: LAPAROSCOPIC UMBILICAL HERNIA WITH MESH;  Surgeon: Diamantina Monks, MD;  Location: [facility] OR;  Service: General;  Laterality: N/A;   Family History  Problem Relation Age of Onset   CAD Mother    Social History   Socioeconomic History   Marital status: Married    Spouse name: Not on file   Number of children: Not on file   Years of education: Not on file   Highest education level: Not on file  Occupational History   Not on file  Tobacco Use   Smoking status: Never   Smokeless tobacco: Never  Vaping Use   Vaping Use: Never used  Substance and Sexual Activity   Alcohol use: No   Drug use: No   Sexual activity: Yes  Other Topics Concern   Not on file  Social History Narrative   Not on file   Social Determinants of Health   Financial Resource Strain: Low Risk  (09/01/2022)   Overall Financial Resource Strain (CARDIA)    Difficulty of Paying Living Expenses: Not hard at all  Food Insecurity: No Food Insecurity (09/01/2022)   Hunger Vital Sign    Worried About Running Out of Food in the Last Year: Never true    Ran Out of Food in the Last Year: Never true  Transportation Needs: No Transportation Needs (09/01/2022)   PRAPARE - Administrator, Civil Service (Medical): No    Lack of Transportation (Non-Medical): No  Physical  Activity: Inactive (09/06/2022)   Exercise Vital Sign    Days of Exercise per Week: 0 days    Minutes of Exercise per Session: 0 min  Stress: No Stress Concern Present (09/01/2022)   [employer] of Occupational Health - Occupational Stress Questionnaire    Feeling of Stress : Not at all  Social Connections: Unknown (09/01/2022)   Social Connection and Isolation Panel [NHANES]    Frequency of Communication with Friends and Family: More than three times a week    Frequency of Social Gatherings with Friends and Family: More than three times a week    Attends Religious Services: Not on file    Active Member of Clubs or Organizations: Yes  Attends Engineer, structural: More than 4 times per year    Marital Status: Married    Tobacco Counseling Counseling given: Not Answered   Clinical Intake:  Pre-visit preparation completed: Yes  Pain : No/denies pain     Nutritional Status: BMI 25 -29 Overweight Nutritional Risks: None Diabetes: No  How often do you need to have someone help you when you read instructions, pamphlets, or other written materials from your doctor or pharmacy?: 1 - Never  Diabetic? no  Interpreter Needed?: No  Information entered by :: NAllen LPN   Activities of Daily Living    09/01/2022    9:40 AM 05/04/2022    9:24 AM  In your present state of health, do you have any difficulty performing the following activities:  Hearing? 0   Vision? 0   Difficulty concentrating or making decisions? 0   Walking or climbing stairs? 0   Dressing or bathing? 0   Doing errands, shopping? 0 0  Preparing Food and eating ? N   Using the Toilet? N   In the past six months, have you accidently leaked urine? N   Do you have problems with loss of bowel control? N   Managing your Medications? N   Managing your Finances? N   Housekeeping or managing your Housekeeping? N     Patient Care Team: Mliss Sax, MD as PCP - General (Family  Medicine) Corky Crafts, MD as PCP - Cardiology (Cardiology)  Indicate any recent Medical Services you may have received from other than [facility] providers in the past year (date may be approximate).     Assessment:   This is a routine wellness examination for Timothy Hayden.  Hearing/Vision screen Vision Screening - Comments:: No regular eye exams  Dietary issues and exercise activities discussed: Current Exercise Habits: The patient does not participate in regular exercise at present   Goals Addressed             This Visit's Progress    Patient Stated       09/06/2022, wants to weigh 176 pounds       Depression Screen    09/06/2022    2:02 PM 09/02/2021    8:41 AM 09/02/2021    8:38 AM 08/12/2020    3:02 PM 06/18/2020   10:02 AM 11/05/2019    1:42 PM 11/05/2019    1:12 PM  PHQ 2/9 Scores  PHQ - 2 Score 0 0 0 0 0 0 0  PHQ- 9 Score      0     Fall Risk    09/01/2022    9:40 AM 09/02/2021    8:41 AM 08/12/2020    3:01 PM 06/18/2020   10:02 AM 11/05/2019    1:12 PM  Fall Risk   Falls in the past year? 0 0 0 0 0  Number falls in past yr: 0 0 0    Injury with Fall? 0 0 0    Risk for fall due to : Medication side effect      Follow up Falls prevention discussed;Education provided;Falls evaluation completed Falls evaluation completed Falls prevention discussed      FALL RISK PREVENTION PERTAINING TO THE HOME:  Any stairs in or around the home? Yes  If so, are there any without handrails? No  Home free of loose throw rugs in walkways, pet beds, electrical cords, etc? Yes  Adequate lighting in your home to reduce risk of falls? Yes   ASSISTIVE DEVICES  UTILIZED TO PREVENT FALLS:  Life alert? No  Use of a cane, walker or w/c? No  Grab bars in the bathroom? No  Shower chair or bench in shower? No  Elevated toilet seat or a handicapped toilet? Yes   TIMED UP AND GO:  Was the test performed? No .      Cognitive Function:        09/06/2022    2:02 PM  6CIT  Screen  What Year? 0 points  What month? 0 points  What time? 0 points  Count back from 20 0 points  Months in reverse 0 points  Repeat phrase 2 points  Total Score 2 points    Immunizations Immunization History  Administered Date(s) Administered   Fluad Quad(high Dose 65+) 04/01/2021   Hepatitis A 01/02/2014, 05/30/2014   Hepatitis A, Adult 01/02/2014, 05/30/2014   Influenza-Unspecified 01/17/2017   Moderna Sars-Covid-2 Vaccination 06/07/2019, 07/05/2019, 12/12/2019   Pneumococcal Polysaccharide-23 06/18/2020   Tdap 11/13/2008, 11/13/2008, 08/12/2015, 08/12/2015    TDAP status: Up to date  Flu Vaccine status: Up to date  Pneumococcal vaccine status: Up to date  Covid-19 vaccine status: Completed vaccines  Qualifies for Shingles Vaccine? Yes   Zostavax completed No   Shingrix Completed?: No.    Education has been provided regarding the importance of this vaccine. Patient has been advised to call insurance company to determine out of pocket expense if they have not yet received this vaccine. Advised may also receive vaccine at local pharmacy or Health Dept. Verbalized acceptance and understanding.  Screening Tests Health Maintenance  Topic Date Due   Hepatitis C Screening  Never done   Zoster Vaccines- Shingrix (1 of 2) Never done   Pneumonia Vaccine 58+ Years old (2 of 2 - PCV) 06/18/2021   COVID-19 Vaccine (4 - 2023-24 season) 11/20/2021   Medicare Annual Wellness (AWV)  09/03/2022   INFLUENZA VACCINE  10/21/2022   DTaP/Tdap/Td (5 - Td or Tdap) 08/11/2025   Colonoscopy  11/03/2028   HPV VACCINES  Aged Out    Health Maintenance  Health Maintenance Due  Topic Date Due   Hepatitis C Screening  Never done   Zoster Vaccines- Shingrix (1 of 2) Never done   Pneumonia Vaccine 23+ Years old (2 of 2 - PCV) 06/18/2021   COVID-19 Vaccine (4 - 2023-24 season) 11/20/2021   Medicare Annual Wellness (AWV)  09/03/2022    Colorectal cancer screening: Type of screening:  Colonoscopy. Completed 11/04/2018. Repeat every 10 years  Lung Cancer Screening: (Low Dose CT Chest recommended if Age 65-80 years, 30 pack-year currently smoking OR have quit w/in 15years.) does not qualify.   Lung Cancer Screening Referral: no  Additional Screening:  Hepatitis C Screening: does qualify;   Vision Screening: Recommended annual ophthalmology exams for early detection of glaucoma and other disorders of the eye. Is the patient up to date with their annual eye exam?  No  Who is the provider or what is the name of the office in which the patient attends annual eye exams? none If pt is not established with a provider, would they like to be referred to a provider to establish care? No .   Dental Screening: Recommended annual dental exams for proper oral hygiene  Community Resource Referral / Chronic Care Management: CRR required this visit?  No   CCM required this visit?  No      Plan:     I have personally reviewed and noted the following in the patient's chart:  Medical and social history Use of alcohol, tobacco or illicit drugs  Current medications and supplements including opioid prescriptions. Patient is not currently taking opioid prescriptions. Functional ability and status Nutritional status Physical activity Advanced directives List of other physicians Hospitalizations, surgeries, and ER visits in previous 12 months Vitals Screenings to include cognitive, depression, and falls Referrals and appointments  In addition, I have reviewed and discussed with patient certain preventive protocols, quality metrics, and best practice recommendations. A written personalized care plan for preventive services as well as general preventive health recommendations were provided to patient.     Barb Merino, LPN   1/61/0960   Nurse Notes: none  Due to this being a virtual visit, the after visit summary with patients personalized plan was offered to patient via  mail or my-chart. Patient would like to access on my-chart

## 2022-09-09 ENCOUNTER — Other Ambulatory Visit: Payer: Self-pay | Admitting: Interventional Cardiology

## 2022-09-21 ENCOUNTER — Other Ambulatory Visit: Payer: Self-pay | Admitting: Interventional Cardiology

## 2022-09-21 DIAGNOSIS — I2510 Atherosclerotic heart disease of native coronary artery without angina pectoris: Secondary | ICD-10-CM

## 2022-09-21 DIAGNOSIS — E782 Mixed hyperlipidemia: Secondary | ICD-10-CM

## 2022-12-03 ENCOUNTER — Ambulatory Visit (INDEPENDENT_AMBULATORY_CARE_PROVIDER_SITE_OTHER): Payer: Medicare Other | Admitting: Family Medicine

## 2022-12-03 ENCOUNTER — Encounter: Payer: Self-pay | Admitting: Family Medicine

## 2022-12-03 ENCOUNTER — Telehealth: Payer: Self-pay

## 2022-12-03 VITALS — BP 136/80 | HR 80 | Temp 98.4°F | Ht 69.0 in | Wt 191.4 lb

## 2022-12-03 DIAGNOSIS — J069 Acute upper respiratory infection, unspecified: Secondary | ICD-10-CM

## 2022-12-03 DIAGNOSIS — R4181 Age-related cognitive decline: Secondary | ICD-10-CM

## 2022-12-03 LAB — POC COVID19 BINAXNOW: SARS Coronavirus 2 Ag: NEGATIVE

## 2022-12-03 NOTE — Patient Instructions (Signed)
Visit Information  Thank you for taking time to visit with me today. Please don't hesitate to contact me if I can be of assistance to you.   Following are the goals we discussed today:   Goals Addressed             This Visit's Progress    COMPLETED: Care coordionation activities-no follow up required       Care Coordination Interventions: Discussed [facility] services and support. Assessed SDOH. Advised to discuss with primary care physician if services needed in the future.            If you are experiencing a Mental Health or Behavioral Health Crisis or need someone to talk to, please call the Suicide and Crisis Lifeline: 988   Patient verbalizes understanding of instructions and care plan provided today and agrees to view in MyChart. Active MyChart status and patient understanding of how to access instructions and care plan via MyChart confirmed with patient.     The patient has been provided with contact information for the care management team and has been advised to call with any health related questions or concerns.   Bary Leriche, RN, MSN [facility], [facility] Management Community Coordinator Direct Dial: [phone]  Fax: [phone] Website: [email or web address]

## 2022-12-03 NOTE — Patient Outreach (Signed)
Care Coordination   In Person Provider Office Visit Note   12/03/2022 Name: CAYDN CZAPLA MRN: [id number] DOB: [birth year]  RAMARION BLAICH is a 67 y.o. year old male who sees Mliss Sax, MD for primary care. I engaged with Tommye Standard in the providers office today.  What matters to the patients health and wellness today?  Sore throat    Goals Addressed             This Visit's Progress    COMPLETED: Care coordionation activities-no follow up required       Care Coordination Interventions: Discussed [facility] services and support. Assessed SDOH. Advised to discuss with primary care physician if services needed in the future.         SDOH assessments and interventions completed:  Yes  SDOH Interventions Today    Flowsheet Row Most Recent Value  SDOH Interventions   Utilities Interventions Intervention Not Indicated  Health Literacy Interventions Intervention Not Indicated        Care Coordination Interventions:  Yes, provided   Follow up plan: No further intervention required.   Encounter Outcome:  Patient Visit Completed   Bary Leriche, RN, MSN [facility]  [facility], [facility] Management Community Coordinator Direct Dial: [phone]  Fax: [phone] Website: [email or web address]

## 2022-12-03 NOTE — Progress Notes (Signed)
Established Patient Office Visit   Subjective:  Patient ID: Timothy Hayden, male    DOB: Aug 15, 1955  Age: 67 y.o. MRN: [id number]  Chief Complaint  Patient presents with   Sore Throat    Throat irritation and voice change x 2 days.     Sore Throat  Associated symptoms include congestion and coughing. Pertinent negatives include no abdominal pain, headaches or shortness of breath.   Encounter Diagnoses  Name Primary?   Viral upper respiratory tract infection Yes   Age-related cognitive decline    Presents with a 2-day history of fever signs and symptoms to include nasal congestion, postnasal drip, sore throat, cough productive of scant phlegm.  There has been no fevers chills, headaches, nausea or vomiting, wheezing or difficulty breathing.  No history of asthma.  He does not smoke.  His wife is been concerned about his cognitive function.  He denies getting lost when driving.  She has not complained about him repeating himself.  Sometimes when he is driving he may head in the wrong direction but corrects himself.  He was given eyedrops for corneal abrasion and did not follow the instructions written on the medication containers.  He sometimes has trouble with word finding.   Review of Systems  Constitutional: Negative.  Negative for chills and fever.  HENT:  Positive for congestion and sore throat.   Eyes:  Negative for blurred vision, discharge and redness.  Respiratory:  Positive for cough. Negative for shortness of breath and wheezing.   Cardiovascular: Negative.   Gastrointestinal:  Negative for abdominal pain.  Genitourinary: Negative.   Musculoskeletal: Negative.  Negative for joint pain and myalgias.  Skin:  Negative for rash.  Neurological:  Negative for tingling, loss of consciousness, weakness and headaches.  Endo/Heme/Allergies:  Negative for polydipsia.     Current Outpatient Medications:    acetaminophen (TYLENOL) 500 MG tablet, Take 2 tablets (1,000 mg total)  by mouth every 6 (six) hours as needed., Disp: 120 tablet, Rfl: 3   atorvastatin (LIPITOR) 80 MG tablet, TAKE 1 TABLET BY MOUTH EVERYDAY AT BEDTIME, Disp: 90 tablet, Rfl: 2   clopidogrel (PLAVIX) 75 MG tablet, TAKE 1 TABLET BY MOUTH EVERY DAY, Disp: 90 tablet, Rfl: 2   finasteride (PROSCAR) 5 MG tablet, Take 5 mg by mouth daily., Disp: , Rfl:    ibuprofen (ADVIL) 600 MG tablet, Take 1 tablet (600 mg total) by mouth every 6 (six) hours as needed., Disp: 120 tablet, Rfl: 1   metoprolol tartrate (LOPRESSOR) 25 MG tablet, TAKE 1 TABLET BY MOUTH TWICE A DAY, Disp: 180 tablet, Rfl: 2   Multiple Vitamin (MULTI-VITAMINS) TABS, Take 1 tablet by mouth daily. , Disp: , Rfl:    nitroGLYCERIN (NITROSTAT) 0.4 MG SL tablet, Place 1 tablet (0.4 mg total) under the tongue every 5 (five) minutes as needed for chest pain., Disp: 75 tablet, Rfl: 2   Omega-3 Fatty Acids (FISH OIL) 1000 MG CAPS, Take 1,000 mg by mouth daily. , Disp: , Rfl:    sildenafil (REVATIO) 20 MG tablet, TAKE 3-5 TABLETS BY MOUTH AS NEEDED 1 HOUR PRIOR TO SEXUAL ACTIVITY, Disp: 30 tablet, Rfl: 3   tamsulosin (FLOMAX) 0.4 MG CAPS capsule, Take 0.4 mg by mouth daily., Disp: , Rfl:    docusate sodium (COLACE) 100 MG capsule, Take 1 capsule (100 mg total) by mouth 2 (two) times daily., Disp: 60 capsule, Rfl: 2   methocarbamol (ROBAXIN-750) 750 MG tablet, Take 1 tablet (750 mg total) by mouth 4 (  four) times daily., Disp: 120 tablet, Rfl: 1   oxyCODONE (ROXICODONE) 5 MG immediate release tablet, Take 1 tablet (5 mg total) by mouth every 4 (four) hours as needed., Disp: 20 tablet, Rfl: 0   Objective:     BP 136/80   Pulse 80   Temp 98.4 F (36.9 C)   Ht 5\' 9"  (1.753 m)   Wt 191 lb 6.4 oz (86.8 kg)   SpO2 98%   BMI 28.26 kg/m    Physical Exam Constitutional:      General: He is not in acute distress.    Appearance: Normal appearance. He is not ill-appearing, toxic-appearing or diaphoretic.  HENT:     Head: Normocephalic and atraumatic.      Right Ear: Tympanic membrane, ear canal and external ear normal.     Left Ear: Tympanic membrane, ear canal and external ear normal.     Mouth/Throat:     Mouth: Mucous membranes are moist.     Pharynx: Oropharynx is clear. No oropharyngeal exudate or posterior oropharyngeal erythema.     Tonsils: No tonsillar exudate or tonsillar abscesses.  Eyes:     General: No scleral icterus.       Right eye: No discharge.        Left eye: No discharge.     Extraocular Movements: Extraocular movements intact.     Conjunctiva/sclera: Conjunctivae normal.     Pupils: Pupils are equal, round, and reactive to light.  Cardiovascular:     Rate and Rhythm: Normal rate and regular rhythm.  Pulmonary:     Effort: Pulmonary effort is normal. No respiratory distress.     Breath sounds: Normal breath sounds. No wheezing, rhonchi or rales.  Abdominal:     General: Bowel sounds are normal.     Tenderness: There is no abdominal tenderness. There is no guarding.  Musculoskeletal:     Cervical back: No rigidity or tenderness.  Skin:    General: Skin is warm and dry.  Neurological:     Mental Status: He is alert and oriented to person, place, and time.  Psychiatric:        Mood and Affect: Mood normal.        Behavior: Behavior normal.      No results found for any visits on 12/03/22.    The ASCVD Risk score (Arnett DK, et al., 2019) failed to calculate for the following reasons:   The patient has a prior MI or stroke diagnosis    Assessment & Plan:   Viral upper respiratory tract infection  Age-related cognitive decline -     Ambulatory referral to Neurology    Return in about 1 week (around 12/10/2022), or Return in 1 week if not improving.Mliss Sax, MD

## 2022-12-07 ENCOUNTER — Encounter: Payer: Self-pay | Admitting: Physician Assistant

## 2022-12-18 NOTE — Progress Notes (Addendum)
Assessment/Plan:   KENNETH CUARESMA is a very pleasant 67 y.o. 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 MoCA today is 25/30. Workup is in progress.  Etiology is unclear at this time . Patient is able to participate on his IADLs and continues to drive without significant difficulties.   Memory Impairment of unclear etiology  MRI brain without contrast to assess for underlying structural abnormality and assess vascular load  Neurocognitive testing to further evaluate cognitive concerns and determine other underlying cause of memory changes, including potential contribution from sleep, anxiety, attention, or depression  Check B12, TSH Control mood as per PCP Recommend good control of cardiovascular risk factors . BP is hight today, he has been informed of the current values  Folllow up in 1 month   Subjective:   The patient is here alone   How long did patient have memory difficulties?  Less than 1 yea.Reports some difficulty remembering new information, conversations and names.  Long-term memory is good. His wife finishes the sentences for him. Likes to do crossword puzzles, solitaire, sudoku, and word finding. HE continues to work in [employer] without any problems with performance.  repeats oneself?  Denies Disoriented when walking into a room?  Patient denies  Leaving objects in unusual places? Denies.   Wandering behavior?  Denies. Any personality changes?  Denies.   Any history of depression?:  Denies   Hallucinations or paranoia?  Denies   Seizures?  Denies    Any sleep changes?   Sleeps well. He dreams frequently. Denies REM behavior or sleepwalking   Sleep apnea?  Denies   Any hygiene concerns?  Denies   Independent of bathing and dressing?  Endorsed  Does the patient needs help with medications? Patient is in charge.  Who is in charge of the finances?  Patient is in charge.    Any changes in appetite?  Denies    Patient have trouble swallowing? Denies.   Does the patient cook? Not much. Any kitchen accidents such as leaving the stove on? Denies.   Any history of headaches?   Denies.   Chronic pain ? Knee arthritis takes cortisone shots by Ortho  Ambulates with difficulty?  Denies. Walk frequently.  Recent falls or head injuries? Denies.   Vision changes? Denies.   Unilateral weakness, numbness or tingling? Denies.   Any tremors?   Denies.   Any anosmia?  For several years, unless the smell strong.   Any incontinence of urine? Denies.   Any bowel dysfunction? Denies.      Patient lives with wife History of heavy alcohol intake? Denies.   History of heavy tobacco use? Denies.   Family history of dementia? Mother had dementia ? type Does patient drive? Yes, he denies any issues but wife is concerned because sometimes when he is driving he may be heading the wrong direction, although he corrects himself. He attributes that "have too much on my mind".  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 PCIx1 in 1999 b. abnormal nuc, then cath 01/08/2015 DES to 75% mid LAD, 95% small distal LCx treated medically   Hyperlipidemia    Hypertension    Myocardial infarction [facility]) 03/22/1997     Past Surgical History:  Procedure Laterality Date   CARDIAC CATHETERIZATION  N/A 01/08/2015   Procedure: Left Heart Cath and Coronary Angiography;  Surgeon: Corky Crafts, MD;  Location: [facility] INVASIVE CV LAB;  Service: Cardiovascular;  Laterality: N/A;   CARDIAC CATHETERIZATION N/A 01/08/2015   Procedure: Coronary Stent Intervention;  Surgeon: Corky Crafts, MD;  Location: [facility] INVASIVE CV LAB;  Service: Cardiovascular;  Laterality: N/A;  mid lad 2.25x32 synergy   CARDIAC CATHETERIZATION  1999   CARDIAC CATHETERIZATION N/A 05/26/2015   Procedure: Left Heart Cath  and Coronary Angiography;  Surgeon: Corky Crafts, MD;  Location: [facility] INVASIVE CV LAB;  Service: Cardiovascular;  Laterality: N/A;   CORONARY ANGIOPLASTY WITH STENT PLACEMENT  1999   "1 stent"   ENDOSCOPIC STENT PLACEMENT W/ MLB     KIDNEY SURGERY Right 1971   "had a blood vessel removed off my kidney"   UMBILICAL HERNIA REPAIR N/A 05/11/2022   Procedure: LAPAROSCOPIC UMBILICAL HERNIA WITH MESH;  Surgeon: Diamantina Monks, MD;  Location: [facility] OR;  Service: General;  Laterality: N/A;     No Known Allergies  Current Outpatient Medications  Medication Instructions   acetaminophen (TYLENOL) 1,000 mg, Oral, Every 6 hours PRN   atorvastatin (LIPITOR) 80 MG tablet TAKE 1 TABLET BY MOUTH EVERYDAY AT BEDTIME   clopidogrel (PLAVIX) 75 mg, Oral, Daily   finasteride (PROSCAR) 5 mg, Oral, Daily   Fish Oil 1,000 mg, Oral, Daily   ibuprofen (ADVIL) 600 mg, Oral, Every 6 hours PRN   metoprolol tartrate (LOPRESSOR) 25 mg, Oral, 2 times daily   Multiple Vitamin (MULTI-VITAMINS) TABS 1 tablet, Oral, Daily   nitroGLYCERIN (NITROSTAT) 0.4 mg, Sublingual, Every 5 min PRN   sildenafil (REVATIO) 20 MG tablet TAKE 3-5 TABLETS BY MOUTH AS NEEDED 1 HOUR PRIOR TO SEXUAL ACTIVITY   tamsulosin (FLOMAX) 0.4 mg, Oral, Daily     VITALS:   Vitals:   12/22/22 0741 12/22/22 0908  BP: (!) 160/100 (!) 167/90  Pulse: 72   Resp: 20   SpO2: 98%   Weight: 192 lb (87.1 kg)   Height: 5\' 9"  (1.753 m)     PHYSICAL EXAM   HEENT:  Normocephalic, atraumatic. The superficial temporal arteries are without ropiness or tenderness. Cardiovascular: Regular rate and rhythm. Lungs: Clear to auscultation bilaterally. Neck: There are no carotid bruits noted bilaterally.  NEUROLOGICAL:    12/22/2022   12:00 PM  Montreal Cognitive Assessment   Visuospatial/ Executive (0/5) 4  Naming (0/3) 3  Attention: Read list of digits (0/2) 2  Attention: Read list of letters (0/1) 1  Attention: Serial 7 subtraction starting at 100  (0/3) 3  Language: Repeat phrase (0/2) 1  Language : Fluency (0/1) 1  Abstraction (0/2) 1  Delayed Recall (0/5) 4  Orientation (0/6) 5  Total 25  Adjusted Score (based on education) 25        No data to display           Orientation:  Alert and oriented to person, place and time. No aphasia or dysarthria. Fund of knowledge is appropriate. Recent memory impaired and remote memory intact.  Attention and concentration are normal.  Able to name objects and repeat phrases. Delayed recall  4/5 Cranial nerves: There is good facial symmetry. Extraocular muscles are intact and visual fields are full to confrontational testing. Speech is fluent and clear. No tongue deviation. Hearing is intact to conversational tone. Tone: Tone is good throughout. Sensation: Sensation is intact to light touch. Vibration is intact at the bilateral big toe.  Coordination: The patient  has no difficulty with RAM's or FNF bilaterally. Normal finger to nose  Motor: Strength is 5/5 in the bilateral upper and lower extremities. There is no pronator drift. There are no fasciculations noted. DTR's: Deep tendon reflexes are 2/4 bilaterally. Gait and Station: The patient is able to ambulate without difficulty The patient is able to heel toe walk . Gait is cautious and narrow. The patient is able to ambulate in a tandem fashion.       Thank you for allowing [place] the opportunity to participate in the care of this nice patient. Please do not hesitate to contact us for any questions or concerns.   Total time spent on today's visit was 62 minutes dedicated to this patient today, preparing to see patient, examining the patient, ordering tests and/or medications and counseling the patient, documenting clinical information in the EHR or other health record, independently interpreting results and communicating results to the patient/family, discussing treatment and goals, answering patient's questions and coordinating care.  Cc:   Mliss Sax, MD  Marlowe Kays 12/22/2022 12:13 PM

## 2022-12-22 ENCOUNTER — Ambulatory Visit (INDEPENDENT_AMBULATORY_CARE_PROVIDER_SITE_OTHER): Payer: Medicare Other | Admitting: Physician Assistant

## 2022-12-22 ENCOUNTER — Encounter: Payer: Self-pay | Admitting: Physician Assistant

## 2022-12-22 ENCOUNTER — Other Ambulatory Visit (INDEPENDENT_AMBULATORY_CARE_PROVIDER_SITE_OTHER): Payer: Medicare Other

## 2022-12-22 ENCOUNTER — Ambulatory Visit: Payer: Medicare Other

## 2022-12-22 VITALS — BP 167/90 | HR 72 | Resp 20 | Ht 69.0 in | Wt 192.0 lb

## 2022-12-22 DIAGNOSIS — R413 Other amnesia: Secondary | ICD-10-CM

## 2022-12-22 LAB — TSH: TSH: 0.66 u[IU]/mL (ref 0.35–5.50)

## 2022-12-22 LAB — VITAMIN B12: Vitamin B-12: 530 pg/mL (ref 211–911)

## 2022-12-22 NOTE — Progress Notes (Signed)
B12 and thyroid levels are normal. Thanks

## 2022-12-22 NOTE — Patient Instructions (Addendum)
It was a pleasure to see you today at our office.   Recommendations:  Neurocognitive evaluation at our office.  MRI of the brain, the radiology office will call you to arrange you appointment  Check labs today  Follow up in 1 months    For psychiatric meds, mood meds: Please have your primary care physician manage these medications.  If you have any severe symptoms of a stroke, or other severe issues such as confusion,severe chills or fever, etc call 911 or go to the ER as you may need to be evaluated further   For guidance regarding [facility] and if placement were needed at the facility, contact Social Worker tel: [phone]  For assessment of decision of mental capacity and competency:  Call Dr. Erick Blinks, geriatric psychiatrist at [phone]  Counseling regarding caregiver distress, including caregiver depression, anxiety and issues regarding community resources, adult day care programs, adult living facilities, or memory care questions:  please contact your  Primary Doctor's Social Worker   Whom to call: Memory  decline, memory medications: Call our office [phone]    Labs today [address] Mri at [facility] [phone]   [email or web address]   RECOMMENDATIONS FOR ALL PATIENTS WITH MEMORY PROBLEMS: 1. Continue to exercise (Recommend 30 minutes of walking everyday, or 3 hours every week) 2. Increase social interactions - continue going to [place] and enjoy social gatherings with friends and family 3. Eat healthy, avoid fried foods and eat more fruits and vegetables 4. Maintain adequate blood pressure, blood sugar, and blood cholesterol level. Reducing the risk of stroke and cardiovascular disease also helps promoting better memory. 5. Avoid stressful situations. Live a simple life and avoid aggravations. Organize your time and prepare for the next day in anticipation. 6. Sleep well,  avoid any interruptions of sleep and avoid any distractions in the bedroom that may interfere with adequate sleep quality 7. Avoid sugar, avoid sweets as there is a strong link between excessive sugar intake, diabetes, and cognitive impairment We discussed the Mediterranean diet, which has been shown to help patients reduce the risk of progressive memory disorders and reduces cardiovascular risk. This includes eating fish, eat fruits and green leafy vegetables, nuts like almonds and hazelnuts, walnuts, and also use olive oil. Avoid fast foods and fried foods as much as possible. Avoid sweets and sugar as sugar use has been linked to worsening of memory function.  There is always a concern of gradual progression of memory problems. If this is the case, then we may need to adjust level of care according to patient needs. Support, both to the patient and caregiver, should then be put into place.      You have been referred for a neuropsychological evaluation (i.e., evaluation of memory and thinking abilities). Please bring someone with you to this appointment if possible, as it is helpful for the doctor to hear from both you and another adult who knows you well. Please bring eyeglasses and hearing aids if you wear them.    The evaluation will take approximately 3 hours and has two parts:   The first part is a clinical interview with the neuropsychologist (Dr. Milbert Coulter or Dr. Roseanne Reno). During the interview, the neuropsychologist will speak with you and the individual you brought to the appointment.    The second part of the evaluation is testing with the doctor's technician Annabelle Harman or Selena Batten). During the testing, the technician will ask you to remember different types of material, solve problems, and answer some  questionnaires. Your family member will not be present for this portion of the evaluation.   Please note: We must reserve several hours of the neuropsychologist's time and the psychometrician's time for  your evaluation appointment. As such, there is a No-Show fee of $100. If you are unable to attend any of your appointments, please contact our office as soon as possible to reschedule.      DRIVING: Regarding driving, in patients with progressive memory problems, driving will be impaired. We advise to have someone else do the driving if trouble finding directions or if minor accidents are reported. Independent driving assessment is available to determine safety of driving.   If you are interested in the driving assessment, you can contact the following:  [facility] in [place] [phone]  [facility] [phone]  [facility] [phone]  [facility] [phone] or [phone]   FALL PRECAUTIONS: Be cautious when walking. Scan the area for obstacles that may increase the risk of trips and falls. When getting up in the mornings, sit up at the edge of the bed for a few minutes before getting out of bed. Consider elevating the bed at the head end to avoid drop of blood pressure when getting up. Walk always in a well-lit room (use night lights in the walls). Avoid area rugs or power cords from appliances in the middle of the walkways. Use a walker or a cane if necessary and consider physical therapy for balance exercise. Get your eyesight checked regularly.  FINANCIAL OVERSIGHT: Supervision, especially oversight when making financial decisions or transactions is also recommended.  HOME SAFETY: Consider the safety of the kitchen when operating appliances like stoves, microwave oven, and blender. Consider having supervision and share cooking responsibilities until no longer able to participate in those. Accidents with firearms and other hazards in the house should be identified and addressed as well.   ABILITY TO BE LEFT ALONE: If patient is unable to contact 911 operator, consider using LifeLine, or when the need is there, arrange for  someone to stay with patients. Smoking is a fire hazard, consider supervision or cessation. Risk of wandering should be assessed by caregiver and if detected at any point, supervision and safe proof recommendations should be instituted.  MEDICATION SUPERVISION: Inability to self-administer medication needs to be constantly addressed. Implement a mechanism to ensure safe administration of the medications.      Mediterranean Diet A Mediterranean diet refers to food and lifestyle choices that are based on the traditions of countries located on the [employer] [place]. This way of eating has been shown to help prevent certain conditions and improve outcomes for people who have chronic diseases, like kidney disease and heart disease. What are tips for following this plan? Lifestyle  Cook and eat meals together with your family, when possible. Drink enough fluid to keep your urine clear or pale yellow. Be physically active every day. This includes: Aerobic exercise like running or swimming. Leisure activities like gardening, walking, or housework. Get 7-8 hours of sleep each night. If recommended by your health care provider, drink red wine in moderation. This means 1 glass a day for nonpregnant women and 2 glasses a day for men. A glass of wine equals 5 oz (150 mL). Reading food labels  Check the serving size of packaged foods. For foods such as rice and pasta, the serving size refers to the amount of cooked product, not dry. Check the total fat in packaged foods. Avoid foods that have saturated  fat or trans fats. Check the ingredients list for added sugars, such as corn syrup. Shopping  At the grocery store, buy most of your food from the areas near the walls of the store. This includes: Fresh fruits and vegetables (produce). Grains, beans, nuts, and seeds. Some of these may be available in unpackaged forms or large amounts (in bulk). Fresh seafood. Poultry and eggs. Low-fat dairy  products. Buy whole ingredients instead of prepackaged foods. Buy fresh fruits and vegetables in-season from local farmers markets. Buy frozen fruits and vegetables in resealable bags. If you do not have access to quality fresh seafood, buy precooked frozen shrimp or canned fish, such as tuna, salmon, or sardines. Buy small amounts of raw or cooked vegetables, salads, or olives from the deli or salad bar at your store. Stock your pantry so you always have certain foods on hand, such as olive oil, canned tuna, canned tomatoes, rice, pasta, and beans. Cooking  Cook foods with extra-virgin olive oil instead of using butter or other vegetable oils. Have meat as a side dish, and have vegetables or grains as your main dish. This means having meat in small portions or adding small amounts of meat to foods like pasta or stew. Use beans or vegetables instead of meat in common dishes like chili or lasagna. Experiment with different cooking methods. Try roasting or broiling vegetables instead of steaming or sauteing them. Add frozen vegetables to soups, stews, pasta, or rice. Add nuts or seeds for added healthy fat at each meal. You can add these to yogurt, salads, or vegetable dishes. Marinate fish or vegetables using olive oil, lemon juice, garlic, and fresh herbs. Meal planning  Plan to eat 1 vegetarian meal one day each week. Try to work up to 2 vegetarian meals, if possible. Eat seafood 2 or more times a week. Have healthy snacks readily available, such as: Vegetable sticks with hummus. Greek yogurt. Fruit and nut trail mix. Eat balanced meals throughout the week. This includes: Fruit: 2-3 servings a day Vegetables: 4-5 servings a day Low-fat dairy: 2 servings a day Fish, poultry, or lean meat: 1 serving a day Beans and legumes: 2 or more servings a week Nuts and seeds: 1-2 servings a day Whole grains: 6-8 servings a day Extra-virgin olive oil: 3-4 servings a day Limit red meat and sweets  to only a few servings a month What are my food choices? Mediterranean diet Recommended Grains: Whole-grain pasta. Brown rice. Bulgar wheat. Polenta. Couscous. Whole-wheat bread. Orpah Cobb. Vegetables: Artichokes. Beets. Broccoli. Cabbage. Carrots. Eggplant. Green beans. Chard. Kale. Spinach. Onions. Leeks. Peas. Squash. Tomatoes. Peppers. Radishes. Fruits: Apples. Apricots. Avocado. Berries. Bananas. Cherries. Dates. Figs. Grapes. Lemons. Melon. Oranges. Peaches. Plums. Pomegranate. Meats and other protein foods: Beans. Almonds. Sunflower seeds. Pine nuts. Peanuts. Cod. Salmon. Scallops. Shrimp. Tuna. Tilapia. Clams. Oysters. Eggs. Dairy: Low-fat milk. Cheese. Greek yogurt. Beverages: Water. Red wine. Herbal tea. Fats and oils: Extra virgin olive oil. Avocado oil. Grape seed oil. Sweets and desserts: [place] yogurt with honey. Baked apples. Poached pears. Trail mix. Seasoning and other foods: Basil. Cilantro. Coriander. Cumin. Mint. Parsley. Sage. Rosemary. Tarragon. Garlic. Oregano. Thyme. Pepper. Balsalmic vinegar. Tahini. Hummus. Tomato sauce. Olives. Mushrooms. Limit these Grains: Prepackaged pasta or rice dishes. Prepackaged cereal with added sugar. Vegetables: Deep fried potatoes (french fries). Fruits: Fruit canned in syrup. Meats and other protein foods: Beef. Pork. Lamb. Poultry with skin. Hot dogs. Tomasa Blase. Dairy: Ice cream. Sour cream. Whole milk. Beverages: Juice. Sugar-sweetened soft drinks. Beer. Liquor and  spirits. Fats and oils: Butter. Canola oil. Vegetable oil. Beef fat (tallow). Lard. Sweets and desserts: Cookies. Cakes. Pies. Candy. Seasoning and other foods: Mayonnaise. Premade sauces and marinades. The items listed may not be a complete list. Talk with your dietitian about what dietary choices are right for you. Summary The Mediterranean diet includes both food and lifestyle choices. Eat a variety of fresh fruits and vegetables, beans, nuts, seeds, and whole  grains. Limit the amount of red meat and sweets that you eat. Talk with your health care provider about whether it is safe for you to drink red wine in moderation. This means 1 glass a day for nonpregnant women and 2 glasses a day for men. A glass of wine equals 5 oz (150 mL). This information is not intended to replace advice given to you by your health care provider. Make sure you discuss any questions you have with your health care provider. Document Released: 10/30/2015 Document Revised: 12/02/2015 Document Reviewed: 10/30/2015 Elsevier Interactive Patient Education  2017 [employer].

## 2022-12-23 ENCOUNTER — Other Ambulatory Visit: Payer: Self-pay | Admitting: Physician Assistant

## 2022-12-23 DIAGNOSIS — R413 Other amnesia: Secondary | ICD-10-CM

## 2023-01-21 ENCOUNTER — Ambulatory Visit
Admission: RE | Admit: 2023-01-21 | Discharge: 2023-01-21 | Disposition: A | Discharge status: Home / Self Care | Payer: Medicare Other | Source: Ambulatory Visit | Attending: Physician Assistant | Admitting: Physician Assistant

## 2023-01-21 ENCOUNTER — Inpatient Hospital Stay: Admission: RE | Admit: 2023-01-21 | Payer: Medicare Other | Source: Ambulatory Visit

## 2023-01-22 NOTE — Progress Notes (Signed)
The MRI of the brain  showed age related changes. did not show any abnormalities no strokes, thanks

## 2023-02-09 ENCOUNTER — Encounter: Payer: Self-pay | Admitting: Physician Assistant

## 2023-02-09 ENCOUNTER — Ambulatory Visit (INDEPENDENT_AMBULATORY_CARE_PROVIDER_SITE_OTHER): Payer: Medicare Other | Admitting: Physician Assistant

## 2023-02-09 VITALS — BP 144/83 | HR 70 | Resp 18 | Ht 69.0 in | Wt 189.0 lb

## 2023-02-09 DIAGNOSIS — R413 Other amnesia: Secondary | ICD-10-CM

## 2023-02-09 MED ORDER — DONEPEZIL HCL 5 MG PO TABS: 5.0000 mg | ORAL_TABLET | Freq: Every day | ORAL | 0 refills | Status: DC

## 2023-02-09 MED ORDER — DONEPEZIL HCL 5 MG PO TBDP: 5.0000 mg | ORAL_TABLET | Freq: Every day | ORAL | 11 refills | Status: DC

## 2023-02-09 NOTE — Patient Instructions (Signed)
It was a pleasure to see you today at our office.   Recommendations:  Neurocognitive evaluation at our office.  Start donepezil sublingual 5 mg daily  Follow up in 3 months    For psychiatric meds, mood meds: Please have your primary care physician manage these medications.  If you have any severe symptoms of a stroke, or other severe issues such as confusion,severe chills or fever, etc call 911 or go to the ER as you may need to be evaluated further     For assessment of decision of mental capacity and competency:  Call Dr. Erick Blinks, geriatric psychiatrist at [phone]  Counseling regarding caregiver distress, including caregiver depression, anxiety and issues regarding community resources, adult day care programs, adult living facilities, or memory care questions:  please contact your  Primary Doctor's Social Worker   Whom to call: Memory  decline, memory medications: Call our office [phone]    Labs today [address] Mri at [facility] [phone]   [email or web address]   RECOMMENDATIONS FOR ALL PATIENTS WITH MEMORY PROBLEMS: 1. Continue to exercise (Recommend 30 minutes of walking everyday, or 3 hours every week) 2. Increase social interactions - continue going to [facility] and enjoy social gatherings with friends and family 3. Eat healthy, avoid fried foods and eat more fruits and vegetables 4. Maintain adequate blood pressure, blood sugar, and blood cholesterol level. Reducing the risk of stroke and cardiovascular disease also helps promoting better memory. 5. Avoid stressful situations. Live a simple life and avoid aggravations. Organize your time and prepare for the next day in anticipation. 6. Sleep well, avoid any interruptions of sleep and avoid any distractions in the bedroom that may interfere with adequate sleep quality 7. Avoid sugar, avoid sweets as there is a strong link between excessive sugar  intake, diabetes, and cognitive impairment We discussed the Mediterranean diet, which has been shown to help patients reduce the risk of progressive memory disorders and reduces cardiovascular risk. This includes eating fish, eat fruits and green leafy vegetables, nuts like almonds and hazelnuts, walnuts, and also use olive oil. Avoid fast foods and fried foods as much as possible. Avoid sweets and sugar as sugar use has been linked to worsening of memory function.  There is always a concern of gradual progression of memory problems. If this is the case, then we may need to adjust level of care according to patient needs. Support, both to the patient and caregiver, should then be put into place.      You have been referred for a neuropsychological evaluation (i.e., evaluation of memory and thinking abilities). Please bring someone with you to this appointment if possible, as it is helpful for the doctor to hear from both you and another adult who knows you well. Please bring eyeglasses and hearing aids if you wear them.    The evaluation will take approximately 3 hours and has two parts:   The first part is a clinical interview with the neuropsychologist (Dr. Milbert Coulter or Dr. Roseanne Reno). During the interview, the neuropsychologist will speak with you and the individual you brought to the appointment.    The second part of the evaluation is testing with the doctor's technician Annabelle Harman or Selena Batten). During the testing, the technician will ask you to remember different types of material, solve problems, and answer some questionnaires. Your family member will not be present for this portion of the evaluation.   Please note: We must reserve several hours of the neuropsychologist's time and the psychometrician's time  for your evaluation appointment. As such, there is a No-Show fee of $100. If you are unable to attend any of your appointments, please contact our office as soon as possible to reschedule.       DRIVING: Regarding driving, in patients with progressive memory problems, driving will be impaired. We advise to have someone else do the driving if trouble finding directions or if minor accidents are reported. Independent driving assessment is available to determine safety of driving.   If you are interested in the driving assessment, you can contact the following:  [facility] [employer] [facility] in [place] [phone]  [facility] [phone]  [facility] [phone]  [facility] [phone] or [phone]   FALL PRECAUTIONS: Be cautious when walking. Scan the area for obstacles that may increase the risk of trips and falls. When getting up in the mornings, sit up at the edge of the bed for a few minutes before getting out of bed. Consider elevating the bed at the head end to avoid drop of blood pressure when getting up. Walk always in a well-lit room (use night lights in the walls). Avoid area rugs or power cords from appliances in the middle of the walkways. Use a walker or a cane if necessary and consider physical therapy for balance exercise. Get your eyesight checked regularly.  FINANCIAL OVERSIGHT: Supervision, especially oversight when making financial decisions or transactions is also recommended.  HOME SAFETY: Consider the safety of the kitchen when operating appliances like stoves, microwave oven, and blender. Consider having supervision and share cooking responsibilities until no longer able to participate in those. Accidents with firearms and other hazards in the house should be identified and addressed as well.   ABILITY TO BE LEFT ALONE: If patient is unable to contact 911 operator, consider using LifeLine, or when the need is there, arrange for someone to stay with patients. Smoking is a fire hazard, consider supervision or cessation. Risk of wandering should be assessed by caregiver and if detected at any point, supervision and  safe proof recommendations should be instituted.  MEDICATION SUPERVISION: Inability to self-administer medication needs to be constantly addressed. Implement a mechanism to ensure safe administration of the medications.      Mediterranean Diet A Mediterranean diet refers to food and lifestyle choices that are based on the traditions of countries located on the [employer]. This way of eating has been shown to help prevent certain conditions and improve outcomes for people who have chronic diseases, like kidney disease and heart disease. What are tips for following this plan? Lifestyle  Cook and eat meals together with your family, when possible. Drink enough fluid to keep your urine clear or pale yellow. Be physically active every day. This includes: Aerobic exercise like running or swimming. Leisure activities like gardening, walking, or housework. Get 7-8 hours of sleep each night. If recommended by your health care provider, drink red wine in moderation. This means 1 glass a day for nonpregnant women and 2 glasses a day for men. A glass of wine equals 5 oz (150 mL). Reading food labels  Check the serving size of packaged foods. For foods such as rice and pasta, the serving size refers to the amount of cooked product, not dry. Check the total fat in packaged foods. Avoid foods that have saturated fat or trans fats. Check the ingredients list for added sugars, such as corn syrup. Shopping  At the grocery store, buy most of your food from the areas near the  walls of the store. This includes: Fresh fruits and vegetables (produce). Grains, beans, nuts, and seeds. Some of these may be available in unpackaged forms or large amounts (in bulk). Fresh seafood. Poultry and eggs. Low-fat dairy products. Buy whole ingredients instead of prepackaged foods. Buy fresh fruits and vegetables in-season from local farmers markets. Buy frozen fruits and vegetables in resealable bags. If you do  not have access to quality fresh seafood, buy precooked frozen shrimp or canned fish, such as tuna, salmon, or sardines. Buy small amounts of raw or cooked vegetables, salads, or olives from the deli or salad bar at your store. Stock your pantry so you always have certain foods on hand, such as olive oil, canned tuna, canned tomatoes, rice, pasta, and beans. Cooking  Cook foods with extra-virgin olive oil instead of using butter or other vegetable oils. Have meat as a side dish, and have vegetables or grains as your main dish. This means having meat in small portions or adding small amounts of meat to foods like pasta or stew. Use beans or vegetables instead of meat in common dishes like chili or lasagna. Experiment with different cooking methods. Try roasting or broiling vegetables instead of steaming or sauteing them. Add frozen vegetables to soups, stews, pasta, or rice. Add nuts or seeds for added healthy fat at each meal. You can add these to yogurt, salads, or vegetable dishes. Marinate fish or vegetables using olive oil, lemon juice, garlic, and fresh herbs. Meal planning  Plan to eat 1 vegetarian meal one day each week. Try to work up to 2 vegetarian meals, if possible. Eat seafood 2 or more times a week. Have healthy snacks readily available, such as: Vegetable sticks with hummus. Greek yogurt. Fruit and nut trail mix. Eat balanced meals throughout the week. This includes: Fruit: 2-3 servings a day Vegetables: 4-5 servings a day Low-fat dairy: 2 servings a day Fish, poultry, or lean meat: 1 serving a day Beans and legumes: 2 or more servings a week Nuts and seeds: 1-2 servings a day Whole grains: 6-8 servings a day Extra-virgin olive oil: 3-4 servings a day Limit red meat and sweets to only a few servings a month What are my food choices? Mediterranean diet Recommended Grains: Whole-grain pasta. Brown rice. Bulgar wheat. Polenta. Couscous. Whole-wheat bread. Orpah Cobb. Vegetables: Artichokes. Beets. Broccoli. Cabbage. Carrots. Eggplant. Green beans. Chard. Kale. Spinach. Onions. Leeks. Peas. Squash. Tomatoes. Peppers. Radishes. Fruits: Apples. Apricots. Avocado. Berries. Bananas. Cherries. Dates. Figs. Grapes. Lemons. Melon. Oranges. Peaches. Plums. Pomegranate. Meats and other protein foods: Beans. Almonds. Sunflower seeds. Pine nuts. Peanuts. Cod. Salmon. Scallops. Shrimp. Tuna. Tilapia. Clams. Oysters. Eggs. Dairy: Low-fat milk. Cheese. Greek yogurt. Beverages: Water. Red wine. Herbal tea. Fats and oils: Extra virgin olive oil. Avocado oil. Grape seed oil. Sweets and desserts: [place] yogurt with honey. Baked apples. Poached pears. Trail mix. Seasoning and other foods: Basil. Cilantro. Coriander. Cumin. Mint. Parsley. Sage. Rosemary. Tarragon. Garlic. Oregano. Thyme. Pepper. Balsalmic vinegar. Tahini. Hummus. Tomato sauce. Olives. Mushrooms. Limit these Grains: Prepackaged pasta or rice dishes. Prepackaged cereal with added sugar. Vegetables: Deep fried potatoes (french fries). Fruits: Fruit canned in syrup. Meats and other protein foods: Beef. Pork. Lamb. Poultry with skin. Hot dogs. Tomasa Blase. Dairy: Ice cream. Sour cream. Whole milk. Beverages: Juice. Sugar-sweetened soft drinks. Beer. Liquor and spirits. Fats and oils: Butter. Canola oil. Vegetable oil. Beef fat (tallow). Lard. Sweets and desserts: Cookies. Cakes. Pies. Candy. Seasoning and other foods: Mayonnaise. Premade sauces and marinades. The items  listed may not be a complete list. Talk with your dietitian about what dietary choices are right for you. Summary The Mediterranean diet includes both food and lifestyle choices. Eat a variety of fresh fruits and vegetables, beans, nuts, seeds, and whole grains. Limit the amount of red meat and sweets that you eat. Talk with your health care provider about whether it is safe for you to drink red wine in moderation. This means 1 glass a day for  nonpregnant women and 2 glasses a day for men. A glass of wine equals 5 oz (150 mL). This information is not intended to replace advice given to you by your health care provider. Make sure you discuss any questions you have with your health care provider. Document Released: 10/30/2015 Document Revised: 12/02/2015 Document Reviewed: 10/30/2015 Elsevier Interactive Patient Education  2017 [employer].

## 2023-02-09 NOTE — Progress Notes (Signed)
Assessment/Plan:   Memory Impairment of unclear etiology  Timothy Hayden is a very pleasant 67 y.o. RH male seen today in follow up to discuss the MRI of the brain results, performed on 01/21/2023. These were personally reviewed, remarkable for mild to moderate generalized cerebral atrophy, and minimal chronic small vessel ischemic changes within the cerebral white matter, no acute findings.  Patient is not currently on any antidementia meds. He was last seen on 12/22/22 with [facility] 24/30.   Discussed initiating donepezil 5 mg daily, interested in proceeding. This patient is here alone. Previous records as well as any outside records available were reviewed prior to todays visit.     Follow up in 3  months. Start donepezil 5 mg ODT daily, side effects discussed.(Due to cost on regular oral dose) Patient is scheduled for neuropsych evaluation in the near future for clarity of the diagnosis. Recommend good control of cardiovascular risk factors Continue to control mood as per PCP    Initial visit 12/22/22  How long did patient have memory difficulties?  Less than 1 year.Reports some difficulty remembering new information, conversations and names.  Long-term memory is good. His wife finishes the sentences for him. Likes to do crossword puzzles, solitaire, sudoku, and word finding. He continues to work in [employer] without any problems with performance.  repeats oneself?  Denies Disoriented when walking into a room?  Patient denies  Leaving objects in unusual places? Denies.   Wandering behavior?  Denies. Any personality changes?  Denies.   Any history of depression?:  Denies   Hallucinations or paranoia?  Denies   Seizures?  Denies    Any sleep changes?   Sleeps well. He dreams frequently. Denies REM behavior or sleepwalking   Sleep apnea?  Denies   Any hygiene concerns?  Denies   Independent of bathing and dressing?  Endorsed  Does the patient needs help with medications? Patient is in  charge.  Who is in charge of the finances? Patient is in charge.    Any changes in appetite?  Denies    Patient have trouble swallowing? Denies.   Does the patient cook? Not much. Any kitchen accidents such as leaving the stove on? Denies.   Any history of headaches?   Denies.   Chronic pain ? Knee arthritis takes cortisone shots by Ortho  Ambulates with difficulty?  Denies. Walk frequently.  Recent falls or head injuries? Denies.   Vision changes? Denies.   Unilateral weakness, numbness or tingling? Denies.   Any tremors?   Denies.   Any anosmia?  For several years, unless the smell strong.   Any incontinence of urine? Denies.   Any bowel dysfunction? Denies.      Patient lives with wife History of heavy alcohol intake? Denies.   History of heavy tobacco use? Denies.   Family history of dementia? Mother had dementia ? type Does patient drive? Yes, he denies any issues but wife is concerned because sometimes when he is driving he may be heading the wrong direction, although he corrects himself. He attributes that "have too much on my mind".   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   atorvastatin (LIPITOR) 80 MG tablet TAKE 1 TABLET BY MOUTH EVERYDAY AT BEDTIME   clopidogrel (PLAVIX) 75 MG tablet TAKE 1 TABLET BY MOUTH EVERY DAY   finasteride (PROSCAR) 5 MG tablet Take 5 mg by mouth daily.   ibuprofen (ADVIL) 600 MG tablet Take 1 tablet (600 mg total) by mouth every 6 (six) hours as needed.   metoprolol tartrate (LOPRESSOR) 25 MG tablet TAKE 1 TABLET BY MOUTH TWICE A DAY   Multiple Vitamin (MULTI-VITAMINS) TABS Take 1 tablet by mouth daily.     nitroGLYCERIN (NITROSTAT) 0.4 MG SL tablet Place 1 tablet (0.4 mg total) under the tongue every 5 (five) minutes as needed for chest pain.   Omega-3 Fatty Acids (FISH OIL) 1000 MG CAPS Take 1,000 mg by mouth daily.    sildenafil (REVATIO) 20 MG tablet TAKE 3-5 TABLETS BY MOUTH AS NEEDED 1 HOUR PRIOR TO SEXUAL ACTIVITY   tamsulosin (FLOMAX) 0.4 MG CAPS capsule Take 0.4 mg by mouth daily.   No facility-administered encounter medications on file as of 02/09/2023.        No data to display            12/23/2022   12:00 PM 12/22/2022   12:00 PM  Montreal Cognitive Assessment   Visuospatial/ Executive (0/5) 2 4  Naming (0/3) 3 3  Attention: Read list of digits (0/2) 2 2  Attention: Read list of letters (0/1) 1 1  Attention: Serial 7 subtraction starting at 100 (0/3) 3 3  Language: Repeat phrase (0/2) 1 1  Language : Fluency (0/1) 1 1  Abstraction (0/2) 1 1  Delayed Recall (0/5) 4 4  Orientation (0/6) 5 5  Total 23 25  Adjusted Score (based on education) 23 25   Thank you for allowing [place] the opportunity to participate in the care of this nice patient. Please do not hesitate to contact us for any questions or concerns.   Total time spent on today's visit was 32 minutes dedicated to this patient today, preparing to see patient, examining the patient, ordering tests and/or medications and counseling the patient, documenting clinical information in the EHR or other health record, independently interpreting results and communicating results to the patient/family, discussing treatment and goals, answering patient's questions and coordinating care.  Cc:  Mliss Sax, MD  Marlowe Kays 02/09/2023 9:24 AM

## 2023-03-21 ENCOUNTER — Encounter: Payer: Self-pay | Admitting: Physician Assistant

## 2023-05-25 ENCOUNTER — Ambulatory Visit: Payer: Medicare Other | Admitting: Physician Assistant

## 2023-05-30 ENCOUNTER — Ambulatory Visit: Payer: Medicare Other | Admitting: Physician Assistant

## 2023-06-12 ENCOUNTER — Other Ambulatory Visit: Payer: Self-pay | Admitting: Interventional Cardiology

## 2023-06-12 DIAGNOSIS — I2510 Atherosclerotic heart disease of native coronary artery without angina pectoris: Secondary | ICD-10-CM

## 2023-06-12 DIAGNOSIS — E782 Mixed hyperlipidemia: Secondary | ICD-10-CM

## 2023-06-19 NOTE — Progress Notes (Unsigned)
 Cardiology Office Note    Patient Name: Timothy Hayden Date of Encounter: 06/19/2023  Primary Care Provider:  Mliss Sax, MD Primary Cardiologist:  Lance Muss, MD Primary Electrophysiologist: None   Past Medical History    Past Medical History:  Diagnosis Date   CAD (coronary artery disease)    a. PCIx1 in 1999 b. abnormal nuc, then cath 01/08/2015 DES to 75% mid LAD, 95% small distal LCx treated medically   Hyperlipidemia    Hypertension    Myocardial infarction [facility]) 03/22/1997    History of Present Illness  Timothy Hayden is a 68 y.o. male with a PMH of CAD s/p PCI in 1999 and subsequent LHC 12/2014 with DES to 75% mid LAD 95% distal left circumflex treated with DES x 2 and 50% ISR in D1, HLD, HTN who presents today for 1 year follow-up.  Timothy Hayden was seen initially by Dr. 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  He had underwent previous ischemic evaluation 8/15 that was normal.  He underwent a follow-up stress echo that was abnormal and outpatient LHC was arranged.  LHC demonstrated 95% distal left circumflex that was treated medically and 75% mid LAD stenosis treated with DES x 1 with 50% ISR in D1.  He was seen in 2017 and underwent a Myoview that was intermediate risk with recommendation of repeat LHC that showed no change in coronary anatomy with recommendation to continue medical therapy of small vessel disease in LCx.  He saw an FAA cardiologist who agreed to fix the circumflex and underwent placement of 2 stents.  He had a repeat ETT in [place] that was normal.  He was last seen on 05/17/2022 for follow-up and reported doing well with no new cardiac complaints.  He underwent a PET/CT stress test prior to visit on 04/20/2022 that was low risk with previous medium size areas of infarction consistent with previous test.  He was seen by neurology on 02/09/2023 due to memory impairment and was started on donepezil 5 mg.  Patient denies  chest pain, palpitations, dyspnea, PND, orthopnea, nausea, vomiting, dizziness, syncope, edema, weight gain, or early satiety.   Discussed the use of AI scribe software for clinical note transcription with the patient, who gave verbal consent to proceed.  History of Present Illness    ***Notes: -Last ischemic evaluation:  Review of Systems  Please see the history of present illness.    All other systems reviewed and are otherwise negative except as noted above.  Physical Exam    Wt Readings from Last 3 Encounters:  02/09/23 189 lb (85.7 kg)  12/22/22 192 lb (87.1 kg)  12/03/22 191 lb 6.4 oz (86.8 kg)   ZO:XWRUE were no vitals filed for this visit.,There is no height or weight on file to calculate BMI. GEN: Well nourished, well developed in no acute distress Neck: No JVD; No carotid bruits Pulmonary: Clear to auscultation without rales, wheezing or rhonchi  Cardiovascular: Normal rate. Regular rhythm. Normal S1. Normal S2.   Murmurs: There is no murmur.  ABDOMEN: Soft, non-tender, non-distended EXTREMITIES:  No edema; No deformity   EKG/LABS/ Recent Cardiac Studies   ECG personally reviewed by me today - ***  Risk Assessment/Calculations:   {Does this patient have ATRIAL FIBRILLATION?:[phone]}      Lab Results  Component Value Date   WBC 5.8 05/04/2022   HGB 15.1 05/04/2022   HCT 46.2 05/04/2022   MCV 90.6 05/04/2022   PLT 196 05/04/2022  Lab Results  Component Value Date   CREATININE 0.88 05/04/2022   BUN 17 05/04/2022   NA 136 05/04/2022   K 4.0 05/04/2022   CL 104 05/04/2022   CO2 23 05/04/2022   Lab Results  Component Value Date   CHOL 131 05/13/2022   HDL 52 05/13/2022   LDLCALC 57 05/13/2022   TRIG 122 05/13/2022   CHOLHDL 2.5 05/13/2022    No results found for: "HGBA1C" Assessment & Plan    1.  History of CAD: -s/p PCI in 1999 with subsequent LHC in 2017 with DES to the LAD and LHC in 2018 with DES x 2 to distal left  circumflex -Patient/ischemic evaluation completed in 2024 by PET CT that was low risk  2.  Essential hypertension:  3.  Hyperlipidemia:  4.***      Disposition: Follow-up with Lance Muss, MD or APP in *** months {Are you ordering a CV Procedure (e.g. stress test, cath, DCCV, TEE, etc)?   Press F2        :[id number]}   Signed, Napoleon Form, Leodis Rains, NP 06/19/2023, 2:19 PM [facility]

## 2023-06-20 ENCOUNTER — Other Ambulatory Visit: Payer: Self-pay

## 2023-06-20 ENCOUNTER — Encounter: Payer: Self-pay | Admitting: Nurse Practitioner

## 2023-06-20 ENCOUNTER — Ambulatory Visit: Payer: Medicare Other | Attending: Nurse Practitioner | Admitting: Nurse Practitioner

## 2023-06-20 VITALS — BP 120/70 | HR 60 | Ht 69.0 in | Wt 189.0 lb

## 2023-06-20 DIAGNOSIS — I10 Essential (primary) hypertension: Secondary | ICD-10-CM

## 2023-06-20 DIAGNOSIS — E782 Mixed hyperlipidemia: Secondary | ICD-10-CM | POA: Insufficient documentation

## 2023-06-20 DIAGNOSIS — I25118 Atherosclerotic heart disease of native coronary artery with other forms of angina pectoris: Secondary | ICD-10-CM | POA: Insufficient documentation

## 2023-06-20 DIAGNOSIS — R413 Other amnesia: Secondary | ICD-10-CM | POA: Insufficient documentation

## 2023-06-20 NOTE — Patient Instructions (Addendum)
 Medication Instructions:  Your physician recommends that you continue on your current medications as directed. Please refer to the Current Medication list given to you today. *If you need a refill on your cardiac medications before your next appointment, please call your pharmacy*  Lab Work: IN THE FUTURE fasting labs LIPIDS & LFTS If you have labs (blood work) drawn today and your tests are completely normal, you will receive your results only by: MyChart Message (if you have MyChart) OR A paper copy in the mail If you have any lab test that is abnormal or we need to change your treatment, we will call you to review the results.  Testing/Procedures: non  Follow-Up: At [facility], you and your health needs are our priority.  As part of our continuing mission to provide you with exceptional heart care, our providers are all part of one team.  This team includes your primary Cardiologist (physician) and Advanced Practice Providers or APPs (Physician Assistants and Nurse Practitioners) who all work together to provide you with the care you need, when you need it.  Your next appointment:   6 month(s)  Provider:   Earney Hamburg, MD  We recommend signing up for the patient portal called "MyChart".  Sign up information is provided on this After Visit Summary.  MyChart is used to connect with patients for Virtual Visits (Telemedicine).  Patients are able to view lab/test results, encounter notes, upcoming appointments, etc.  Non-urgent messages can be sent to your provider as well.   To learn more about what you can do with MyChart, go to [email or web address].   Other Instructions       1st Floor: - Lobby - Registration  - Pharmacy  - Lab - Cafe  2nd Floor: - PV Lab - Diagnostic Testing (echo, CT, nuclear med)  3rd Floor: - Vacant  4th Floor: - TCTS (cardiothoracic surgery) - AFib Clinic - Structural Heart Clinic - Vascular Surgery  - Vascular  Ultrasound  5th Floor: - HeartCare Cardiology (general and EP) - Clinical Pharmacy for coumadin, hypertension, lipid, weight-loss medications, and med management appointments    Valet parking services will be available as well.

## 2023-06-21 ENCOUNTER — Ambulatory Visit (INDEPENDENT_AMBULATORY_CARE_PROVIDER_SITE_OTHER): Payer: Medicare Other | Admitting: Psychology

## 2023-06-21 ENCOUNTER — Ambulatory Visit: Payer: Self-pay

## 2023-06-21 DIAGNOSIS — R4189 Other symptoms and signs involving cognitive functions and awareness: Secondary | ICD-10-CM

## 2023-06-21 DIAGNOSIS — R419 Unspecified symptoms and signs involving cognitive functions and awareness: Secondary | ICD-10-CM | POA: Diagnosis not present

## 2023-06-21 NOTE — Progress Notes (Unsigned)
 NEUROPSYCHOLOGICAL EVALUATION [place]. [facility]  [place] Department of Neurology  Date of Evaluation: 06/21/2023  REASON FOR REFERRAL   Timothy Hayden is a 68 year old, right-handed, White male with 15 years of formal education. He was referred for neuropsychological evaluation by Marlowe Kays, PA-C, to assess current neurocognitive functioning, document potential cognitive deficits, and assist with treatment planning. This is his first neuropsychological evaluation.  SUMMARY OF RESULTS   Premorbid cognitive abilities are estimated to be in the average range based on word reading and sociodemographic factors. On testing today, scores were below expectations on tasks of novel problem-solving, word list recognition, and fine motor dexterity in the nondominant hand. Performance on the problem-solving task may underestimate his true ability, as it was later discovered that he was matching cards to the discard pile rather than the key cards at the top. Regarding the low word list recognition discrimination score, item-level analysis revealed that the three words he was unable to recognize were never encoded during immediate recall trials, rather than being forgotten after a delay. Additionally, false positive errors were low. Remaining scores in the domains of attention/working memory, processing speed, executive functioning, language, visuospatial skills, learning/memory, and fine motor dexterity in the dominant hand were intact and, at times, exceeded expectations. On self-report questionnaires, he did not endorse clinically-elevated levels of depression or anxiety.  DIAGNOSTIC IMPRESSION   Results of the current evaluation were broadly within expectations, aside from a few isolated low scores that did not cluster to any single domain. He does not show evidence of a neurocognitive disorder at this time. Isolated weaknesses identified today are not overly concerning in the broader  context of his assessment. Weakness may relate to normal aging or test anxiety. 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 Results further serve as a baseline for future comparison, should it ever become necessary to re-evaluate the patient. Recommendations are listed below.  ICD-10 Codes: R41.9 Cognitive concerns  RECOMMENDATIONS   In consultation with your doctor, schedule cognitive reevaluation on an as-needed basis to assess for cognitive decline and update treatment recommendations.  Patient is encouraged to continue managing vascular risk factors through a heart healthy diet, physician-approved physical activity, and medication adherence.   He is also encouraged to participate in activities that he finds enjoyable and fulfilling, whether that be hobbies, socializing with loved ones, or being outdoors. This can improve mood, increase motivation, and offer cognitive stimulation.  To maximize independence and maintain daily functioning, he may wish to consider implementing compensatory strategies. Examples include:  -Adhere to routine. Compensatory strategies work best when they are used consistently. Use a planner, calendar, or white board that has the schedule and important events for the day clearly listed to reference and cross off when tasks are complete.  -Ask for written information, especially if it is new or unfamiliar (e.g., information provided at a doctor's appointment).  -Create an organized environment. Keep items that can be easily misplaced in a sensible location and get into the habit of always returning the items to those places.  -Pay attention and reduce distractions. Make a point of focusing attention on information you want to remember. One-on-one interaction is more likely to facilitate attention and minimize distraction. Make eye contact and repeat  the information out loud after you hear it. Reduce interruptions or distractions especially when attempting to learn new information.  -Create associations. When learning something  new, think about and understand the information. Explain it in your own words or try to associate it with something you already know. Take notes to help remember important details. -Evaluate goals and plan accordingly. When confronted by many different tasks, begin by making a list that prioritizes each task and estimates the time it will take to complete. Break down complicated tasks into smaller, more manageable steps.  -Focus on one task at a time and complete each task before starting another. Avoid multitasking.   DISPOSITION   Patient will follow up with the referring provider, Ms. Wertman. No follow-up neuropsychological testing was scheduled at this time. Please feel free to refer the patient for repeated evaluation if he shows a significant change in neurocognitive status. He will be provided verbal feedback in approximately one week regarding the findings and impression during this visit.  The remainder of the report includes the details of the patient's background and a table of results from the current evaluation, which support the summary and recommendations described above.  BACKGROUND   History of Presenting Illness: The following information was obtained from a review of medical records and an interview with the patient. Patient established care with neurology in October 2024 due to cognitive concerns. At the time, he reported memory difficulties for less than a year and was still functioning independently. MoCA = 25/30. He was prescribed donepezil but discontinued taking it after 4-5 weeks due to pain and vivid dreams.  Cognitive Functioning: During today's appointment, the patient reported that his wife initiated the process of having his memory evaluated due to a family history of dementia in his mother.  Patient denied major cognitive concerns at this time, although he endorsed slight difficulties in word finding, attention (e.g., passing of the road when driving), multitasking, and processing speed (e.g., calculating a tap). While he may occasionally forget short-term details, he denied forgetting more important information. He believes that "distractions are a big thing" affecting optimal cognitive functioning. Cognitive changes have been present over the past 1-1.5 years.  Physical Functioning: Patient denied difficulties with sleep initiation and maintenance. Appetite is stable. No changes to sense of smell were reported, though he noted a mild decline in his ability to taste certain foods. Vision and hearing are stable. He denied difficulties with balance and falls. He noted a very slight tremor in his left hand when holding a weighted object.  Emotional Functioning: Patient reported his current mood as "fairly good." He noted feeling somewhat anxious about today's appointment but otherwise described his mood is stable. He denied suicidal ideation. 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  Imaging: MRI of the brain (01/21/2023) documented mild to moderate generalized cerebral atrophy, minimal chronic small vessel ischemic disease, and a 2 mm focus of susceptibility weighted signal loss within the left parietal lobe which may reflect a chronic microhemorrhage or calcification.  Other Relevant Medical History: Remarkable for hypertension, hyperlipidemia, coronary artery disease, erectile dysfunction due to arterial insufficiency, h/o myocardial infarction, and benign prostatic hyperplasia. No history of stroke, CNS infection, head injury, or seizure was reported.  Current Medications: Per patient, atorvastatin, clopidogrel, finasteride, ibuprofen, metoprolol, multiple vitamin,  nitroglycerin, omega-3 fatty acids, sildenafil, and tamsulosin.   Functional Status: Patient independently performs all ADLs and IADLs, including driving, managing finances, and managing medications.  Family Neurological History: Remarkable for dementia (mother) and Parkinson's disease (father).  Psychiatric History: History of depression,  anxiety, prior mental health treatment, suicidal ideation, hallucinations, and psychiatric hospitalizations was not reported.  Substance Use History: Patient denied current use of alcohol, nicotine, marijuana, and illicit substances.  Social and Developmental History: Patient was born in [place], [place]. History of perinatal complications and developmental delays was not reported. He is married and has four children who live locally. He lives with his wife in their private residence.  Educational and Occupational History: No history of childhood learning disability, special education services, or grade retention was reported. Patient completed high school on time then attended three years of college, obtaining an associate degree. 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  BEHAVIORAL OBSERVATIONS   Patient arrived early and was unaccompanied. He ambulated independently and without gait disturbance. He was alert and fully oriented. He was appropriately groomed and dressed for the setting. No significant sensory or motor abnormalities were observed. Vision and hearing were adequate for testing purposes. Speech was of normal rate, prosody, and volume. No conversational word-finding difficulties, paraphasic errors, or dysarthria were observed. Comprehension was conversationally intact. Thought processes were linear, logical, and coherent. Thought content was organized and devoid of delusions. Insight appeared intact. Affect was even and congruent with mood. He was  cooperative and gave adequate effort during testing, including on standalone and embedded measures of performance validity. Results are thought to accurately reflect his cognitive functioning at this time.  NEUROPSYCHOLOGICAL TESTING RESULTS   Tests Administered: Animal Naming Test; Beck Anxiety Inventory (BAI); Beck Depression Inventory II (BDI-II); Brief Visuospatial Memory Test-Revised (BVMT-R) - Form 1; Controlled Oral Word Association Test (COWAT): FAS; Grooved Pegboard Test; Hopkins Verbal Learning Test Revised (HVLT-R) - From 1; Judgment of Line Orientation (JLO) - Form H; Neuropsychological Assessment Battery (NAB) - Subtest(s): Naming Form 1; Standalone performance validity test (PVT); Test of Premorbid Functioning (TOPF); Trail Making Test (TMT); Wechsler Adult Intelligence Scale Fourth Edition (WAIS-IV) - Subtest(s): Block Design, Matrix Reasoning, Similarities, Digit Span, Symbol Search, Coding; Wechsler Memory Scale Fourth Edition (WMS-IV) - Subtest(s): Logical Memory (LM); and [employer] Card Version (WCST-64).  Test results are provided in the table below. Whenever possible, the patient's scores were compared against age-, sex-, and education-corrected normative samples. Interpretive descriptions are based on the AACN consensus conference statement on uniform labeling (Guilmette et al., 2020).  PREMORBID FUNCTIONING RAW  RANGE  TOPF 48 StdS=105 Average  ATTENTION & WORKING MEMORY RAW  RANGE  WAIS-IV Digit Span -- ss=14 High Average  Forward -- ss=14 High Average  Backward -- ss=14 High Average  Sequencing -- ss=11 Average  PROCESSING SPEED RAW  RANGE  Trails A 50''1e T=38 Low Average  WAIS-IV Symbol Search -- ss=7 Low Average  WAIS-IV Coding  -- ss=9 Average  EXECUTIVE FUNCTION RAW  RANGE  Trails B 152''1e T=37 Low Average  WAIS-IV Matrix Reasoning -- ss=9 Average  WAIS-IV Similarities -- ss=13 High Average  COWAT Letter Fluency 21+22+24 T=75 Exceptionally  High  WCST-64 Total Errors 41 T=27 Exceptionally Low  WCST-64 Perseverative Errors 29 T=29 Exceptionally Low  WCST-64 Nonperseverative Errors 12 T=38 Low Average  WCST-64 Categories Completed 0 2-5%ile Below Average  WCSR-64 FMS 1 -- --  LANGUAGE RAW  RANGE  COWAT Letter Fluency 21+22+24 T=75 Exceptionally High  Animal Naming Test 25 T=65 Above Average  NAB Naming Test 31/31 T=56 WNL  VISUOSPATIAL RAW  RANGE  WAIS-IV Block Design -- ss=11  Average  JLO 31/30 86+%ile High Averag to Exceptionally High  BVMT-R Copy Trial 12/12 -- WNL  VERBAL LEARNING & MEMORY RAW  RANGE  HVLT Learning Trials (6+7+9)/36 T=46 Average  HVLT Delayed Recall 6/12 T=41 Low Average  HVLT Recognition Hits 9 -- --  HVLT Recognition False Positives 1 -- --  HVLT Discrimination Index 8 T=36 Below Average  WMS-IV LM-I  (7+13+8)/53 ss=8 Average  WMS-IV LM-II  (6+8)/39 ss=8 Average  WMS-IV LM Recognition  (7+11)/23 26-50%ile Average  VISUOSPATIAL LEARNING & MEMORY RAW  RANGE  BVMT-R Total Recall (5+6+8)/36 T=46 Average  BVMT-R Delayed Recall 6/12 T=41 Low Average  BVMT-R Percent Retained 75 11-16%ile Low Average  BVMT-R Recognition Hits 5 >16%ile WNL  BVMT-R Recognition False Alarms 0 >16%ile WNL  BVMT-R Recognition Discrimination Index 5 >16%ile WNL  FINE MOTOR DEXTERITY RAW  RANGE  Grooved Pegboard (Dominant Hand) 96''0d T=41 Low Average  Grooved Pegboard (Non-Dominant Hand) 146''0d T=32 Below Average  QUESTIONNAIRES RAW  RANGE  BDI 0 -- Minimal  BAI 3 -- Minimal  *Note: ss = scaled score; StdS = standard score; T = t-score; C/S = corrected raw score; WNL = within normal limits; BNL= below normal limits; D/C = discontinued. Scores from skewed distributions are typically interpreted as WNL (>=16th %ile) or BNL (<16th %ile).   INFORMED CONSENT   Patient was provided with a verbal description of the nature and purpose of the neuropsychological evaluation. Also reviewed were the foreseeable risks and/or  discomforts and benefits of the procedure, limits of confidentiality, and mandatory reporting requirements of this provider. Patient was given the opportunity to have their questions answered. Oral consent to participate was provided by the patient.   This report was prepared as part of a clinical evaluation and is not intended for forensic use.  SERVICE   This evaluation was conducted by Annice Pih, Psy.D. In addition to time spent directly with the patient, total professional time includes record review, integration of relevant medical history, test selection, interpretation of findings, and report preparation. A technician, Wallace Keller, B.S., provided testing and scoring assistance for 170 minutes.  Psychiatric Diagnostic Evaluation Services (Professional): [phone] x 1 Neuropsychological Testing Evaluation Services (Professional): [phone] x 1 Neuropsychological Testing Evaluation Services (Professional): 91478 x 1 Neuropsychological Test Administration and Scoring Radiographer, therapeutic): [phone] x 1 Neuropsychological Test Administration and Scoring (Technician): [phone] x 5  This report was generated using voice recognition software. While this document has been carefully reviewed, transcription errors may be present. I apologize in advance for any inconvenience. Please contact me if further clarification is needed.            Annice Pih, Psy.D.             Neuropsychologist

## 2023-06-21 NOTE — Progress Notes (Signed)
   Psychometrician Note   Cognitive testing was administered to Timothy Hayden by Timothy Hayden, B.S. (psychometrist) under the supervision of Dr. Annice Pih, Psy.D., licensed psychologist on 06/21/2023. Timothy Hayden did not appear overtly distressed by the testing session per behavioral observation or responses across self-report questionnaires. Rest breaks were offered.    The battery of tests administered was selected by Dr. Annice Pih, Psy.D. with consideration to Timothy Hayden current level of functioning, the nature of his symptoms, emotional and behavioral responses during interview, level of literacy, observed level of motivation/effort, and the nature of the referral question. This battery was communicated to the psychometrist. Communication between Dr. Annice Pih, Psy.D. and the psychometrist was ongoing throughout the evaluation and Dr. Annice Pih, Psy.D. was immediately accessible at all times. Dr. Annice Pih, Psy.D. provided supervision to the psychometrist on the date of this service to the extent necessary to assure the quality of all services provided.    Timothy Hayden will return within approximately 1-2 weeks for an interactive feedback session with Timothy Hayden at which time his test performances, clinical impressions, and treatment recommendations will be reviewed in detail. Timothy Hayden understands he can contact our office should he require our assistance before this time.  A total of 170 minutes of billable time were spent face-to-face with Timothy Hayden by the psychometrist. This includes both test administration and scoring time. Billing for these services is reflected in the clinical report generated by Dr. Annice Pih, Psy.D.  This note reflects time spent with the psychometrician and does not include test scores or any clinical interpretations made by Timothy Hayden. The full report will follow in a separate note.

## 2023-06-28 ENCOUNTER — Ambulatory Visit (INDEPENDENT_AMBULATORY_CARE_PROVIDER_SITE_OTHER): Payer: Medicare Other | Admitting: Psychology

## 2023-06-28 DIAGNOSIS — R419 Unspecified symptoms and signs involving cognitive functions and awareness: Secondary | ICD-10-CM | POA: Diagnosis not present

## 2023-06-28 NOTE — Progress Notes (Signed)
   NEUROPSYCHOLOGY FEEDBACK SESSION [place]. [facility]  [place] Department of Neurology  Date of Feedback Session: 06/28/2023  REASON FOR REFERRAL   Timothy Hayden is a 68 year old, right-handed, White male with 15 years of formal education. He was referred for neuropsychological evaluation by Marlowe Kays, PA-C, to assess current neurocognitive functioning, document potential cognitive deficits, and assist with treatment planning.  FEEDBACK   Patient completed a comprehensive neuropsychological evaluation on 06/21/2023. Please refer to that encounter for the full report and recommendations. Briefly, results were broadly within expectations, aside from a few isolated low scores that did not cluster to any single domain. He does not show evidence of a neurocognitive disorder at this time. Isolated weaknesses identified today are not overly concerning in the broader context of his assessment. Weakness may relate to normal aging or test anxiety. 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 Results further serve as a baseline for future comparison, should it ever become necessary to re-evaluate the patient.   Today, the patient was unaccompanied. He was provided verbal feedback regarding the findings and impression during this visit, and his questions were answered. A copy of the report was provided at the conclusion of the visit.  DISPOSITION   Patient will follow up with the referring provider, Ms. Wertman. No follow-up neuropsychological testing was scheduled at this time. Please feel free to refer the patient for repeated evaluation if he shows a significant change in neurocognitive status.  SERVICE   This feedback session was conducted by Annice Pih, Psy.D. One unit of 16109 was billed for Dr. Robbie Lis' time spent in preparing, conducting, and documenting  the current feedback session.  This report was generated using voice recognition software. While this document has been carefully reviewed, transcription errors may be present. I apologize in advance for any inconvenience. Please contact me if further clarification is needed.

## 2023-06-30 LAB — HEPATIC FUNCTION PANEL
ALT: 26 IU/L (ref 0–44)
AST: 20 IU/L (ref 0–40)
Albumin: 4.6 g/dL (ref 3.9–4.9)
Alkaline Phosphatase: 102 IU/L (ref 44–121)
Bilirubin Total: 0.5 mg/dL (ref 0.0–1.2)
Bilirubin, Direct: 0.16 mg/dL (ref 0.00–0.40)
Total Protein: 7 g/dL (ref 6.0–8.5)

## 2023-06-30 LAB — LIPID PANEL
Chol/HDL Ratio: 3 ratio (ref 0.0–5.0)
Cholesterol, Total: 150 mg/dL (ref 100–199)
HDL: 50 mg/dL (ref >39)
LDL Chol Calc (NIH): 84 mg/dL (ref 0–99)
Triglycerides: 81 mg/dL (ref 0–149)
VLDL Cholesterol Cal: 16 mg/dL (ref 5–40)

## 2023-07-01 ENCOUNTER — Ambulatory Visit: Payer: Medicare Other | Admitting: Physician Assistant

## 2023-08-16 ENCOUNTER — Other Ambulatory Visit (HOSPITAL_BASED_OUTPATIENT_CLINIC_OR_DEPARTMENT_OTHER): Payer: Self-pay

## 2023-09-10 ENCOUNTER — Other Ambulatory Visit: Payer: Self-pay | Admitting: Interventional Cardiology

## 2023-09-10 DIAGNOSIS — I2510 Atherosclerotic heart disease of native coronary artery without angina pectoris: Secondary | ICD-10-CM

## 2023-09-10 DIAGNOSIS — E782 Mixed hyperlipidemia: Secondary | ICD-10-CM

## 2023-09-11 ENCOUNTER — Other Ambulatory Visit: Payer: Self-pay | Admitting: Interventional Cardiology

## 2023-09-16 ENCOUNTER — Ambulatory Visit (INDEPENDENT_AMBULATORY_CARE_PROVIDER_SITE_OTHER): Payer: Medicare Other

## 2023-09-16 DIAGNOSIS — Z0000 Encounter for general adult medical examination without abnormal findings: Secondary | ICD-10-CM | POA: Diagnosis not present

## 2023-09-16 NOTE — Patient Instructions (Signed)
 Timothy Hayden , Thank you for taking time out of your busy schedule to complete your Annual Wellness Visit with me. I enjoyed our conversation and look forward to speaking with you again next year. I, as well as your care team,  appreciate your ongoing commitment to your health goals. Please review the following plan we discussed and let me know if I can assist you in the future. Your Game plan/ To Do List    Referrals: If you haven't heard from the office you've been referred to, please reach out to them at the phone provided.  N/a Follow up Visits: Next Medicare AWV with our clinical staff: 09/17/2024 at 2:20   Have you seen your provider in the last 6 months (3 months if uncontrolled diabetes)? Yes Next Office Visit with your provider: 10/20/2023 at 8:40  Clinician Recommendations:  Aim for 30 minutes of exercise or brisk walking, 6-8 glasses of water, and 5 servings of fruits and vegetables each day.       This is a list of the screening recommended for you and due dates:  Health Maintenance  Topic Date Due   Hepatitis C Screening  Never done   Zoster (Shingles) Vaccine (1 of 2) Never done   Pneumococcal Vaccine for age over 54 (2 of 2 - PCV) 06/18/2021   COVID-19 Vaccine (4 - 2024-25 season) 11/21/2022   Flu Shot  10/21/2023   Medicare Annual Wellness Visit  09/15/2024   DTaP/Tdap/Td vaccine (5 - Td or Tdap) 08/11/2025   Colon Cancer Screening  11/03/2028   Hepatitis B Vaccine  Aged Out   HPV Vaccine  Aged Out   Meningitis B Vaccine  Aged Out    Advanced directives: (Copy Requested) Please bring a copy of your health care power of attorney and living will to the office to be added to your chart at your convenience. You can mail to [facility] [address] [place], [place] [address] or email to [email or web address] Advance Care Planning is important because it:  [x]  Makes sure you receive the medical care that is consistent with your values, goals, and  preferences  [x]  It provides guidance to your family and loved ones and reduces their decisional burden about whether or not they are making the right decisions based on your wishes.  Follow the link provided in your after visit summary or read over the paperwork we have mailed to you to help you started getting your Advance Directives in place. If you need assistance in completing these, please reach out to us  so that we can help you!  See attachments for Preventive Care and Fall Prevention Tips.

## 2023-09-16 NOTE — Progress Notes (Signed)
 Subjective:   Timothy Hayden is a 68 y.o. who presents for a Medicare Wellness preventive visit.  As a reminder, Annual Wellness Visits don't include a physical exam, and some assessments may be limited, especially if this visit is performed virtually. We may recommend an in-person follow-up visit with your provider if needed.  Visit Complete: Virtual I connected with  Timothy Hayden on 09/16/23 by a audio enabled telemedicine application and verified that I am speaking with the correct person using two identifiers.  Patient Location: Home  Provider Location: Office/Clinic  I discussed the limitations of evaluation and management by telemedicine. The patient expressed understanding and agreed to proceed.  Vital Signs: Because this visit was a virtual/telehealth visit, some criteria may be missing or patient reported. Any vitals not documented were not able to be obtained and vitals that have been documented are patient reported.  VideoError- Librarian, academic were attempted between this provider and patient, however failed, due to patient having technical difficulties OR patient did not have access to video capability.  We continued and completed visit with audio only.   Persons Participating in Visit: Patient.  AWV Questionnaire: No: Patient Medicare AWV questionnaire was not completed prior to this visit.  Cardiac Risk Factors include: advanced age (>42men, >24 women);dyslipidemia;hypertension;male gender     Objective:    Today's Vitals   There is no height or weight on file to calculate BMI.     09/16/2023    2:22 PM 02/09/2023    9:08 AM 12/22/2022    8:00 AM 09/06/2022    2:01 PM 05/04/2022    9:22 AM 09/02/2021    8:39 AM 08/12/2020    3:00 PM  Advanced Directives  Does Patient Have a Medical Advance Directive? Yes Yes Yes Yes Yes Yes Yes  Type of Estate agent of [place];Living will Healthcare Power of [employer] Power of [employer] Power of [place];Living will Healthcare Power of [place];Living will Healthcare Power of [place];Living will Healthcare Power of [place];Living will  Does patient want to make changes to medical advance directive?  No - Patient declined No - Patient declined      Copy of Healthcare Power of Attorney in Chart? No - copy requested No - copy requested No - copy requested No - copy requested No - copy requested No - copy requested No - copy requested    Current Medications (verified) Outpatient Encounter Medications as of 09/16/2023  Medication Sig   atorvastatin  (LIPITOR) 80 MG tablet TAKE 1 TABLET BY MOUTH EVERYDAY AT BEDTIME   clopidogrel  (PLAVIX ) 75 MG tablet TAKE 1 TABLET BY MOUTH EVERY DAY   finasteride (PROSCAR) 5 MG tablet Take 5 mg by mouth daily.   ibuprofen  (ADVIL ) 600 MG tablet Take 1 tablet (600 mg total) by mouth every 6 (six) hours as needed.   metoprolol  tartrate (LOPRESSOR ) 25 MG tablet TAKE 1 TABLET BY MOUTH TWICE A DAY   Multiple Vitamin (MULTI-VITAMINS) TABS Take 1 tablet by mouth daily.    nitroGLYCERIN  (NITROSTAT ) 0.4 MG SL tablet Place 1 tablet (0.4 mg total) under the tongue every 5 (five) minutes as needed for chest pain.   Omega-3 Fatty Acids (Hayden OIL) 1000 MG CAPS Take 1,000 mg by mouth daily.    sildenafil  (REVATIO ) 20 MG tablet TAKE 3-5 TABLETS BY MOUTH AS NEEDED 1 HOUR PRIOR TO SEXUAL ACTIVITY   tamsulosin (FLOMAX) 0.4 MG CAPS capsule Take 0.4 mg by mouth daily.   No facility-administered  encounter medications on file as of 09/16/2023.    Allergies (verified) Patient has no known allergies.   History: Past Medical History:  Diagnosis Date   CAD (coronary artery disease)    a. PCIx1 in 1999 b. abnormal nuc, then cath 01/08/2015 DES to 75% mid LAD, 95% small distal LCx treated medically   Hyperlipidemia    Hypertension    Myocardial infarction (HCC) 03/22/1997   Past Surgical History:  Procedure Laterality Date    CARDIAC CATHETERIZATION N/A 01/08/2015   Procedure: Left Heart Cath and Coronary Angiography;  Surgeon: Candyce GORMAN Reek, MD;  Location: [facility] INVASIVE CV LAB;  Service: Cardiovascular;  Laterality: N/A;   CARDIAC CATHETERIZATION N/A 01/08/2015   Procedure: Coronary Stent Intervention;  Surgeon: Candyce GORMAN Reek, MD;  Location: [facility] INVASIVE CV LAB;  Service: Cardiovascular;  Laterality: N/A;  mid lad 2.25x32 synergy   CARDIAC CATHETERIZATION  1999   CARDIAC CATHETERIZATION N/A 05/26/2015   Procedure: Left Heart Cath and Coronary Angiography;  Surgeon: Candyce GORMAN Reek, MD;  Location: [facility] INVASIVE CV LAB;  Service: Cardiovascular;  Laterality: N/A;   CORONARY ANGIOPLASTY WITH STENT PLACEMENT  1999   1 stent   ENDOSCOPIC STENT PLACEMENT W/ MLB     KIDNEY SURGERY Right 1971   had a blood vessel removed off my kidney   UMBILICAL HERNIA REPAIR N/A 05/11/2022   Procedure: LAPAROSCOPIC UMBILICAL HERNIA WITH MESH;  Surgeon: Paola Dreama SAILOR, MD;  Location: [facility] OR;  Service: General;  Laterality: N/A;   Family History  Problem Relation Age of Onset   CAD Mother    Social History   Socioeconomic History   Marital status: Married    Spouse name: Not on file   Number of children: 4   Years of education: 15   Highest education level: Associate degree: academic program  Occupational History   Not on file  Tobacco Use   Smoking status: Never   Smokeless tobacco: Never  Vaping Use   Vaping status: Never Used  Substance and Sexual Activity   Alcohol use: No   Drug use: No   Sexual activity: Yes  Other Topics Concern   Not on file  Social History Narrative   Right handed   3 story home   Drinks caffeine   Rental propertys   Lives with wife   Social Drivers of Health   Financial Resource Strain: Low Risk  (09/16/2023)   Overall Financial Resource Strain (CARDIA)    Difficulty of Paying Living Expenses: Not hard at all  Food Insecurity: No Food Insecurity (09/16/2023)   Hunger  Vital Sign    Worried About Running Out of Food in the Last Year: Never true    Ran Out of Food in the Last Year: Never true  Transportation Needs: No Transportation Needs (09/16/2023)   PRAPARE - Administrator, Civil Service (Medical): No    Lack of Transportation (Non-Medical): No  Physical Activity: Inactive (09/16/2023)   Exercise Vital Sign    Days of Exercise per Week: 0 days    Minutes of Exercise per Session: 0 min  Stress: No Stress Concern Present (09/16/2023)   [employer] of Occupational Health - Occupational Stress Questionnaire    Feeling of Stress: Not at all  Social Connections: Socially Integrated (09/16/2023)   Social Connection and Isolation Panel    Frequency of Communication with Friends and Family: More than three times a week    Frequency of Social Gatherings with Friends and Family: Three times  a week    Attends Religious Services: More than 4 times per year    Active Member of Clubs or Organizations: Yes    Attends Engineer, structural: More than 4 times per year    Marital Status: Married    Tobacco Counseling Counseling given: Not Answered    Clinical Intake:  Pre-visit preparation completed: Yes  Pain : No/denies pain     Nutritional Risks: None Diabetes: No  No results found for: HGBA1C   How often do you need to have someone help you when you read instructions, pamphlets, or other written materials from your doctor or pharmacy?: 1 - Never  Interpreter Needed?: No  Information entered by :: NAllen LPN   Activities of Daily Living     09/16/2023    2:17 PM  In your present state of health, do you have any difficulty performing the following activities:  Hearing? 0  Vision? 0  Difficulty concentrating or making decisions? 0  Walking or climbing stairs? 0  Dressing or bathing? 0  Doing errands, shopping? 0  Preparing Food and eating ? N  Using the Toilet? N  In the past six months, have you accidently  leaked urine? N  Do you have problems with loss of bowel control? N  Managing your Medications? N  Managing your Finances? N  Housekeeping or managing your Housekeeping? N    Patient Care Team: Berneta Elsie Sayre, MD as PCP - General (Family Medicine) Dann Candyce RAMAN, MD as PCP - Cardiology (Cardiology)  I have updated your Care Teams any recent Medical Services you may have received from other providers in the past year.     Assessment:   This is a routine wellness examination for Timothy Hayden.  Hearing/Vision screen Hearing Screening - Comments:: Denies hearing issues Vision Screening - Comments:: No regular eye exams, [facility]   Goals Addressed             This Visit's Progress    Patient Stated       09/16/2023, wants to lose weight       Depression Screen     09/16/2023    2:24 PM 09/06/2022    2:02 PM 09/02/2021    8:41 AM 09/02/2021    8:38 AM 08/12/2020    3:02 PM 06/18/2020   10:02 AM 11/05/2019    1:42 PM  PHQ 2/9 Scores  PHQ - 2 Score 0 0 0 0 0 0 0  PHQ- 9 Score 0      0    Fall Risk     09/16/2023    2:23 PM 02/09/2023    9:07 AM 12/22/2022    8:00 AM 09/01/2022    9:40 AM 09/02/2021    8:41 AM  Fall Risk   Falls in the past year? 0 0 0 0 0  Number falls in past yr: 0 0 0 0 0  Injury with Fall? 0 0 0 0 0  Risk for fall due to : Medication side effect   Medication side effect   Follow up Falls evaluation completed;Falls prevention discussed Falls evaluation completed Falls evaluation completed Falls prevention discussed;Education provided;Falls evaluation completed Falls evaluation completed      Data saved with a previous flowsheet row definition    MEDICARE RISK AT HOME:  Medicare Risk at Home Any stairs in or around the home?: Yes If so, are there any without handrails?: No Home free of loose throw rugs in walkways, pet beds, electrical  cords, etc?: Yes Adequate lighting in your home to reduce risk of falls?: Yes Life alert?: No Use  of a cane, walker or w/c?: No Grab bars in the bathroom?: Yes Shower chair or bench in shower?: No Elevated toilet seat or a handicapped toilet?: Yes  TIMED UP AND GO:  Was the test performed?  No  Cognitive Function: 6CIT completed      12/23/2022   12:00 PM 12/22/2022   12:00 PM  Montreal Cognitive Assessment   Visuospatial/ Executive (0/5) 2 4  Naming (0/3) 3 3  Attention: Read list of digits (0/2) 2 2  Attention: Read list of letters (0/1) 1 1  Attention: Serial 7 subtraction starting at 100 (0/3) 3 3  Language: Repeat phrase (0/2) 1 1  Language : Fluency (0/1) 1 1  Abstraction (0/2) 1 1  Delayed Recall (0/5) 4 4  Orientation (0/6) 5 5  Total 23 25  Adjusted Score (based on education) 23 25      09/16/2023    2:24 PM 09/06/2022    2:02 PM  6CIT Screen  What Year? 0 points 0 points  What month? 0 points 0 points  What time? 0 points 0 points  Count back from 20 0 points 0 points  Months in reverse 2 points 0 points  Repeat phrase 2 points 2 points  Total Score 4 points 2 points    Immunizations Immunization History  Administered Date(s) Administered   Fluad Quad(high Dose 65+) 04/01/2021   Hepatitis A 01/02/2014, 05/30/2014   Hepatitis A, Adult 01/02/2014, 05/30/2014   Influenza-Unspecified 01/17/2017   Moderna Sars-Covid-2 Vaccination 06/07/2019, 07/05/2019, 12/12/2019   Pneumococcal Polysaccharide-23 06/18/2020   Tdap 11/13/2008, 11/13/2008, 08/12/2015, 08/12/2015    Screening Tests Health Maintenance  Topic Date Due   Hepatitis C Screening  Never done   Zoster Vaccines- Shingrix (1 of 2) Never done   Pneumococcal Vaccine: 50+ Years (2 of 2 - PCV) 06/18/2021   COVID-19 Vaccine (4 - 2024-25 season) 11/21/2022   INFLUENZA VACCINE  10/21/2023   Medicare Annual Wellness (AWV)  09/15/2024   DTaP/Tdap/Td (5 - Td or Tdap) 08/11/2025   Colonoscopy  11/03/2028   Hepatitis B Vaccines  Aged Out   HPV VACCINES  Aged Out   Meningococcal B Vaccine  Aged Out     Health Maintenance  Health Maintenance Due  Topic Date Due   Hepatitis C Screening  Never done   Zoster Vaccines- Shingrix (1 of 2) Never done   Pneumococcal Vaccine: 50+ Years (2 of 2 - PCV) 06/18/2021   COVID-19 Vaccine (4 - 2024-25 season) 11/21/2022   Health Maintenance Items Addressed: Due for pneumonia and shingles vaccine. Declines covid vaccine. Due for Hep C screening.  Additional Screening:  Vision Screening: Recommended annual ophthalmology exams for early detection of glaucoma and other disorders of the eye. Would you like a referral to an eye doctor? No    Dental Screening: Recommended annual dental exams for proper oral hygiene  Community Resource Referral / Chronic Care Management: CRR required this visit?  No   CCM required this visit?  No   Plan:    I have personally reviewed and noted the following in the patient's chart:   Medical and social history Use of alcohol, tobacco or illicit drugs  Current medications and supplements including opioid prescriptions. Patient is not currently taking opioid prescriptions. Functional ability and status Nutritional status Physical activity Advanced directives List of other physicians Hospitalizations, surgeries, and ER visits in previous 12  months Vitals Screenings to include cognitive, depression, and falls Referrals and appointments  In addition, I have reviewed and discussed with patient certain preventive protocols, quality metrics, and best practice recommendations. A written personalized care plan for preventive services as well as general preventive health recommendations were provided to patient.   Ardella FORBES Dawn, LPN   [id number]   After Visit Summary: (MyChart) Due to this being a telephonic visit, the after visit summary with patients personalized plan was offered to patient via MyChart   Notes: Nothing significant to report at this time.

## 2023-10-20 ENCOUNTER — Ambulatory Visit: Payer: PRIVATE HEALTH INSURANCE | Admitting: Family Medicine

## 2023-10-20 ENCOUNTER — Ambulatory Visit: Payer: Self-pay | Admitting: Family Medicine

## 2023-10-20 ENCOUNTER — Encounter: Payer: Self-pay | Admitting: Family Medicine

## 2023-10-20 VITALS — BP 120/70 | HR 60 | Temp 98.0°F | Ht 69.0 in | Wt 190.0 lb

## 2023-10-20 DIAGNOSIS — R3911 Hesitancy of micturition: Secondary | ICD-10-CM

## 2023-10-20 DIAGNOSIS — I2510 Atherosclerotic heart disease of native coronary artery without angina pectoris: Secondary | ICD-10-CM

## 2023-10-20 DIAGNOSIS — I10 Essential (primary) hypertension: Secondary | ICD-10-CM | POA: Diagnosis not present

## 2023-10-20 DIAGNOSIS — Z23 Encounter for immunization: Secondary | ICD-10-CM

## 2023-10-20 DIAGNOSIS — N401 Enlarged prostate with lower urinary tract symptoms: Secondary | ICD-10-CM | POA: Diagnosis not present

## 2023-10-20 DIAGNOSIS — Z131 Encounter for screening for diabetes mellitus: Secondary | ICD-10-CM

## 2023-10-20 DIAGNOSIS — Z0000 Encounter for general adult medical examination without abnormal findings: Secondary | ICD-10-CM

## 2023-10-20 LAB — COMPREHENSIVE METABOLIC PANEL WITH GFR
ALT: 22 U/L (ref 0–53)
AST: 15 U/L (ref 0–37)
Albumin: 4.4 g/dL (ref 3.5–5.2)
Alkaline Phosphatase: 82 U/L (ref 39–117)
BUN: 19 mg/dL (ref 6–23)
CO2: 28 meq/L (ref 19–32)
Calcium: 8.6 mg/dL (ref 8.4–10.5)
Chloride: 103 meq/L (ref 96–112)
Creatinine, Ser: 0.94 mg/dL (ref 0.40–1.50)
GFR: 82.91 mL/min (ref >60.00)
Glucose, Bld: 106 mg/dL — ABNORMAL HIGH (ref 70–99)
Potassium: 4 meq/L (ref 3.5–5.1)
Sodium: 138 meq/L (ref 135–145)
Total Bilirubin: 0.6 mg/dL (ref 0.2–1.2)
Total Protein: 6.8 g/dL (ref 6.0–8.3)

## 2023-10-20 LAB — CBC
HCT: 43.5 % (ref 39.0–52.0)
Hemoglobin: 14.7 g/dL (ref 13.0–17.0)
MCHC: 33.8 g/dL (ref 30.0–36.0)
MCV: 86.7 fl (ref 78.0–100.0)
Platelets: 203 K/uL (ref 150.0–400.0)
RBC: 5.01 Mil/uL (ref 4.22–5.81)
RDW: 13.6 % (ref 11.5–15.5)
WBC: 4.7 K/uL (ref 4.0–10.5)

## 2023-10-20 LAB — URINALYSIS, ROUTINE W REFLEX MICROSCOPIC
Bilirubin Urine: NEGATIVE
Hgb urine dipstick: NEGATIVE
Ketones, ur: NEGATIVE
Leukocytes,Ua: NEGATIVE
Nitrite: NEGATIVE
Specific Gravity, Urine: 1.02 (ref 1.000–1.030)
Total Protein, Urine: NEGATIVE
Urine Glucose: NEGATIVE
Urobilinogen, UA: 0.2 (ref 0.0–1.0)
pH: 6 (ref 5.0–8.0)

## 2023-10-20 LAB — LIPID PANEL
Cholesterol: 122 mg/dL (ref 0–200)
HDL: 42.9 mg/dL (ref >39.00)
LDL Cholesterol: 66 mg/dL (ref 0–99)
NonHDL: 79.51
Total CHOL/HDL Ratio: 3
Triglycerides: 70 mg/dL (ref 0.0–149.0)
VLDL: 14 mg/dL (ref 0.0–40.0)

## 2023-10-20 LAB — PSA: PSA: 1.24 ng/mL (ref 0.10–4.00)

## 2023-10-20 LAB — HEMOGLOBIN A1C: Hgb A1c MFr Bld: 6.1 % (ref 4.6–6.5)

## 2023-10-20 NOTE — Progress Notes (Signed)
 Established Patient Office Visit   Subjective:  Patient ID: Timothy Hayden, male    DOB: Oct 05, 1955  Age: 68 y.o. MRN: [id number]  Chief Complaint  Patient presents with   Medical Management of Chronic Issues    No question or concerns Pt is fasting.Pt wants to start Shingrix  vaccine     HPI Encounter Diagnoses  Name Primary?   Healthcare maintenance Yes   Immunization due    Screening for diabetes mellitus    Coronary artery disease involving native coronary artery of native heart without angina pectoris    Essential hypertension    Benign prostatic hyperplasia with urinary hesitancy    Here for physical today.  Doing well.  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  Has regular dental care.  Wife has been concerned about his memory and is currently being followed by neurology.     Review of Systems  Constitutional: Negative.   HENT: Negative.    Eyes:  Negative for blurred vision, discharge and redness.  Respiratory: Negative.    Cardiovascular: Negative.   Gastrointestinal:  Negative for abdominal pain.  Genitourinary: Negative.   Musculoskeletal: Negative.  Negative for myalgias.  Skin:  Negative for rash.  Neurological:  Negative for tingling, loss of consciousness and weakness.  Endo/Heme/Allergies:  Negative for polydipsia.     Current Outpatient Medications:    atorvastatin  (LIPITOR) 80 MG tablet, TAKE 1 TABLET BY MOUTH EVERYDAY AT BEDTIME, Disp: 90 tablet, Rfl: 2   clopidogrel  (PLAVIX ) 75 MG tablet, TAKE 1 TABLET BY MOUTH EVERY DAY, Disp: 90 tablet, Rfl: 0   finasteride (PROSCAR) 5 MG tablet, Take 5 mg by mouth daily., Disp: , Rfl:    ibuprofen  (ADVIL ) 600 MG tablet, Take 1 tablet (600 mg total) by mouth every 6 (six) hours as needed., Disp: 120 tablet, Rfl: 1   metoprolol  tartrate (LOPRESSOR ) 25 MG tablet, TAKE 1 TABLET BY MOUTH TWICE A DAY, Disp: 180 tablet, Rfl: 2   Multiple Vitamin (MULTI-VITAMINS) TABS, Take 1  tablet by mouth daily. , Disp: , Rfl:    nitroGLYCERIN  (NITROSTAT ) 0.4 MG SL tablet, Place 1 tablet (0.4 mg total) under the tongue every 5 (five) minutes as needed for chest pain., Disp: 75 tablet, Rfl: 2   Omega-3 Fatty Acids (FISH OIL) 1000 MG CAPS, Take 1,000 mg by mouth daily. , Disp: , Rfl:    sildenafil  (REVATIO ) 20 MG tablet, TAKE 3-5 TABLETS BY MOUTH AS NEEDED 1 HOUR PRIOR TO SEXUAL ACTIVITY, Disp: 30 tablet, Rfl: 3   tamsulosin (FLOMAX) 0.4 MG CAPS capsule, Take 0.4 mg by mouth daily., Disp: , Rfl:    Objective:     BP 120/70 (BP Location: Right Arm, Patient Position: Sitting, Cuff Size: Normal)   Pulse 60   Temp 98 F (36.7 C) (Temporal)   Ht 5' 9 (1.753 m)   Wt 190 lb (86.2 kg)   SpO2 96%   BMI 28.06 kg/m    Physical Exam Constitutional:      General: He is not in acute distress.    Appearance: Normal appearance. He is not ill-appearing, toxic-appearing or diaphoretic.  HENT:     Head: Normocephalic and atraumatic.     Right Ear: Tympanic membrane, ear canal and external ear normal.     Left Ear: Tympanic membrane, ear canal and external ear normal.     Mouth/Throat:     Mouth: Mucous membranes are moist.     Pharynx: Oropharynx is clear.  No oropharyngeal exudate or posterior oropharyngeal erythema.  Eyes:     General: No scleral icterus.       Right eye: No discharge.        Left eye: No discharge.     Extraocular Movements: Extraocular movements intact.     Conjunctiva/sclera: Conjunctivae normal.     Pupils: Pupils are equal, round, and reactive to light.  Cardiovascular:     Rate and Rhythm: Normal rate and regular rhythm.  Pulmonary:     Effort: Pulmonary effort is normal. No respiratory distress.     Breath sounds: Normal breath sounds.  Abdominal:     General: Bowel sounds are normal.     Tenderness: There is no abdominal tenderness. There is no guarding.     Hernia: A hernia is present. Hernia is present in the ventral area.  Musculoskeletal:      Cervical back: No rigidity or tenderness.  Skin:    General: Skin is warm and dry.  Neurological:     Mental Status: He is alert and oriented to person, place, and time.  Psychiatric:        Mood and Affect: Mood normal.        Behavior: Behavior normal.      No results found for any visits on 10/20/23.    The ASCVD Risk score (Arnett DK, et al., 2019) failed to calculate for the following reasons:   Risk score cannot be calculated because patient has a medical history suggesting prior/existing ASCVD    Assessment & Plan:   Healthcare maintenance  Immunization due -     Varicella-zoster vaccine IM -     Varicella-zoster vaccine subcutaneous; Future  Screening for diabetes mellitus -     Comprehensive metabolic panel with GFR -     Hemoglobin A1c  Coronary artery disease involving native coronary artery of native heart without angina pectoris -     Comprehensive metabolic panel with GFR -     Lipid panel  Essential hypertension -     CBC -     Comprehensive metabolic panel with GFR -     Urinalysis, Routine w reflex microscopic  Benign prostatic hyperplasia with urinary hesitancy -     PSA    Return in about 1 year (around 10/19/2024), or Follow-up in 2 to 6 months for second Shingrix  vaccine.  Continue active healthy lifestyle.  Information the was given on health maintenance and disease prevention.  Will continue follow-up with cardiology, neurology and urology.  Information given on the Shingrix  vaccine  Elsie Sim Lent, MD

## 2023-11-22 ENCOUNTER — Other Ambulatory Visit: Payer: Self-pay

## 2023-11-23 ENCOUNTER — Ambulatory Visit: Payer: PRIVATE HEALTH INSURANCE | Attending: Cardiovascular Disease | Admitting: Cardiovascular Disease

## 2023-11-23 ENCOUNTER — Encounter: Payer: Self-pay | Admitting: Cardiovascular Disease

## 2023-11-23 VITALS — BP 124/72 | HR 54 | Ht 69.0 in | Wt 187.4 lb

## 2023-11-23 DIAGNOSIS — I2510 Atherosclerotic heart disease of native coronary artery without angina pectoris: Secondary | ICD-10-CM | POA: Insufficient documentation

## 2023-11-23 DIAGNOSIS — I10 Essential (primary) hypertension: Secondary | ICD-10-CM | POA: Insufficient documentation

## 2023-11-23 DIAGNOSIS — E782 Mixed hyperlipidemia: Secondary | ICD-10-CM | POA: Insufficient documentation

## 2023-11-23 MED ORDER — CLOPIDOGREL BISULFATE 75 MG PO TABS: 75.0000 mg | ORAL_TABLET | Freq: Every day | ORAL | 3 refills | Status: AC

## 2023-11-23 NOTE — Progress Notes (Signed)
 Chief Complaint  Patient presents with   Follow-up    CAD    History of Present Illness: 68 yo male with history of CAD, HTN and HLD who is here today for cardiac follow up. He has been followed in our office by Dr. Dann. He had placement of a bare metal stent in the Diagonal branch in 1999, drug eluting stent placement in the mid LAD in 2016 and stenting of the small distal Circumflex with 2 drug eluting stents in [place]  in 2018. PET CT January 2024 was low risk with unchanged medium sized infarction consistent with prior testing. He has memory issues and has been seen by Neurology. MRI brain November 2024 with small vessel disease.   He is here today for follow up. The patient denies any chest pain, dyspnea, palpitations, lower extremity edema, orthopnea, PND, dizziness, near syncope or syncope.   Primary Care Physician: Berneta Elsie Sayre, MD   Past Medical History:  Diagnosis Date   CAD (coronary artery disease)    a. PCIx1 in 1999 b. abnormal nuc, then cath 01/08/2015 DES to 75% mid LAD, 95% small distal LCx treated medically   Hyperlipidemia    Hypertension    Myocardial infarction (HCC) 03/22/1997    Past Surgical History:  Procedure Laterality Date   CARDIAC CATHETERIZATION N/A 01/08/2015   Procedure: Left Heart Cath and Coronary Angiography;  Surgeon: Candyce GORMAN Dann, MD;  Location: [facility] INVASIVE CV LAB;  Service: Cardiovascular;  Laterality: N/A;   CARDIAC CATHETERIZATION N/A 01/08/2015   Procedure: Coronary Stent Intervention;  Surgeon: Candyce GORMAN Dann, MD;  Location: [facility] INVASIVE CV LAB;  Service: Cardiovascular;  Laterality: N/A;  mid lad 2.25x32 synergy   CARDIAC CATHETERIZATION  1999   CARDIAC CATHETERIZATION N/A 05/26/2015   Procedure: Left Heart Cath and Coronary Angiography;  Surgeon: Candyce GORMAN Dann, MD;  Location: [facility] INVASIVE CV LAB;  Service: Cardiovascular;  Laterality: N/A;   CORONARY ANGIOPLASTY WITH STENT PLACEMENT  1999   1 stent    ENDOSCOPIC STENT PLACEMENT W/ MLB     KIDNEY SURGERY Right 1971   had a blood vessel removed off my kidney   UMBILICAL HERNIA REPAIR N/A 05/11/2022   Procedure: LAPAROSCOPIC UMBILICAL HERNIA WITH MESH;  Surgeon: Paola Dreama SAILOR, MD;  Location: [facility] OR;  Service: General;  Laterality: N/A;    Current Outpatient Medications  Medication Sig Dispense Refill   atorvastatin  (LIPITOR) 80 MG tablet TAKE 1 TABLET BY MOUTH EVERYDAY AT BEDTIME 90 tablet 2   clopidogrel  (PLAVIX ) 75 MG tablet TAKE 1 TABLET BY MOUTH EVERY DAY 90 tablet 0   finasteride (PROSCAR) 5 MG tablet Take 5 mg by mouth daily.     ibuprofen  (ADVIL ) 600 MG tablet Take 1 tablet (600 mg total) by mouth every 6 (six) hours as needed. 120 tablet 1   metoprolol  tartrate (LOPRESSOR ) 25 MG tablet TAKE 1 TABLET BY MOUTH TWICE A DAY 180 tablet 2   Multiple Vitamin (MULTI-VITAMINS) TABS Take 1 tablet by mouth daily.      nitroGLYCERIN  (NITROSTAT ) 0.4 MG SL tablet Place 1 tablet (0.4 mg total) under the tongue every 5 (five) minutes as needed for chest pain. 75 tablet 2   Omega-3 Fatty Acids (FISH OIL) 1000 MG CAPS Take 1,000 mg by mouth daily.      sildenafil  (REVATIO ) 20 MG tablet TAKE 3-5 TABLETS BY MOUTH AS NEEDED 1 HOUR PRIOR TO SEXUAL ACTIVITY 30 tablet 3   tamsulosin (FLOMAX) 0.4 MG CAPS capsule Take 0.4 mg  by mouth daily.     No current facility-administered medications for this visit.    No Known Allergies  Social History   Socioeconomic History   Marital status: Married    Spouse name: Not on file   Number of children: 4   Years of education: 15   Highest education level: Associate degree: occupational, Scientist, product/process development, or vocational program  Occupational History   Occupation: Retired  Tobacco Use   Smoking status: Never   Smokeless tobacco: Never  Vaping Use   Vaping status: Never Used  Substance and Sexual Activity   Alcohol use: No   Drug use: No   Sexual activity: Yes  Other Topics Concern   Not on file  Social  History Narrative   Right handed   3 story home   Drinks caffeine   Rental propertys   Lives with wife   Social Drivers of Health   Financial Resource Strain: Low Risk  (10/16/2023)   Overall Financial Resource Strain (CARDIA)    Difficulty of Paying Living Expenses: Not hard at all  Food Insecurity: No Food Insecurity (10/16/2023)   Hunger Vital Sign    Worried About Running Out of Food in the Last Year: Never true    Ran Out of Food in the Last Year: Never true  Transportation Needs: No Transportation Needs (10/16/2023)   PRAPARE - Administrator, Civil Service (Medical): No    Lack of Transportation (Non-Medical): No  Physical Activity: Insufficiently Active (10/16/2023)   Exercise Vital Sign    Days of Exercise per Week: 4 days    Minutes of Exercise per Session: 20 min  Stress: No Stress Concern Present (10/16/2023)   [employer] of Occupational Health - Occupational Stress Questionnaire    Feeling of Stress: Not at all  Social Connections: Socially Integrated (10/16/2023)   Social Connection and Isolation Panel    Frequency of Communication with Friends and Family: More than three times a week    Frequency of Social Gatherings with Friends and Family: More than three times a week    Attends Religious Services: More than 4 times per year    Active Member of [employer] or Organizations: Yes    Attends Engineer, structural: More than 4 times per year    Marital Status: Married  Catering manager Violence: Not At Risk (09/16/2023)   Humiliation, Afraid, Rape, and Kick questionnaire    Fear of Current or Ex-Partner: No    Emotionally Abused: No    Physically Abused: No    Sexually Abused: No    Family History  Problem Relation Age of Onset   CAD Mother     Review of Systems:  As stated in the HPI and otherwise negative.   BP 124/72   Pulse (!) 54   Ht 5' 9 (1.753 m)   Wt 187 lb 6.4 oz (85 kg)   SpO2 98%   BMI 27.67 kg/m   Physical  Examination: General: Well developed, well nourished, NAD  HEENT: OP clear, mucus membranes moist  SKIN: warm, dry. No rashes. Neuro: No focal deficits  Musculoskeletal: Muscle strength 5/5 all ext  Psychiatric: Mood and affect normal  Neck: No JVD, no carotid bruits, no thyromegaly, no lymphadenopathy.  Lungs:Clear bilaterally, no wheezes, rhonci, crackles Cardiovascular: Regular rate and rhythm. No murmurs, gallops or rubs. Abdomen:Soft. Bowel sounds present. Non-tender.  Extremities: No lower extremity edema. Pulses are 2 + in the bilateral DP/PT.  EKG:  EKG is not ordered  today. The ekg ordered today demonstrates   Recent Labs: 12/22/2022: TSH 0.66 10/20/2023: ALT 22; BUN 19; Creatinine, Ser 0.94; Hemoglobin 14.7; Platelets 203.0; Potassium 4.0; Sodium 138   Lipid Panel    Component Value Date/Time   CHOL 122 10/20/2023 0930   CHOL 150 06/29/2023 1118   TRIG 70.0 10/20/2023 0930   HDL 42.90 10/20/2023 0930   HDL 50 06/29/2023 1118   CHOLHDL 3 10/20/2023 0930   VLDL 14.0 10/20/2023 0930   LDLCALC 66 10/20/2023 0930   LDLCALC 84 06/29/2023 1118     Wt Readings from Last 3 Encounters:  11/23/23 187 lb 6.4 oz (85 kg)  10/20/23 190 lb (86.2 kg)  06/20/23 189 lb (85.7 kg)    Assessment and Plan:   1. CAD without angina: He has no chest pain. Continue Plavix , statin and beta blocker.   2. HTN: BP is well controlled. Continue current therapy.  3. HLD: LDL 66 in July 2025. Continue Lipitor  Labs/ tests ordered today include:  No orders of the defined types were placed in this encounter.  Disposition:   F/U with me in 12 months  Signed, Lonni Cash, MD, [facility] 11/23/2023 9:14 AM    [facility] [address] [place], [place], [place]  [address] Phone: [phone]; Fax: [phone]

## 2023-11-30 ENCOUNTER — Ambulatory Visit: Payer: Self-pay

## 2023-11-30 ENCOUNTER — Institutional Professional Consult (permissible substitution): Payer: Medicare Other | Admitting: Psychology

## 2023-12-07 ENCOUNTER — Encounter: Payer: Medicare Other | Admitting: Psychology

## 2024-01-12 ENCOUNTER — Ambulatory Visit (INDEPENDENT_AMBULATORY_CARE_PROVIDER_SITE_OTHER)

## 2024-01-12 DIAGNOSIS — Z23 Encounter for immunization: Secondary | ICD-10-CM

## 2024-01-12 NOTE — Progress Notes (Signed)
After obtaining consent, and per orders of Dr. Ethelene Hal, injection of Shingrix given IM in the left deltoid by Alinda Dooms Bromide-White. Patient instructed to remain in clinic for 20 minutes afterwards, and to report any adverse reaction to me immediately.

## 2024-01-17 ENCOUNTER — Ambulatory Visit: Payer: PRIVATE HEALTH INSURANCE

## 2024-02-03 ENCOUNTER — Ambulatory Visit: Payer: Self-pay

## 2024-02-03 NOTE — Telephone Encounter (Signed)
 FYI Only or Action Required?: FYI only for provider: appointment scheduled on 11/17.  Patient was last seen in primary care on 10/20/2023 by Berneta Elsie Sayre, MD.  Called Nurse Triage reporting Tremors.  Symptoms began several months ago.  Interventions attempted: Nothing.  Symptoms are: unchanged.  Triage Disposition: See PCP Within 2 Weeks  Patient/caregiver understands and will follow disposition?: Yes      Copied from CRM [id number]. Topic: Clinical - Red Word Triage >> Feb 03, 2024 12:36 PM Timothy Hayden wrote: [facility] that prompted transfer to Nurse Triage: Patient has been having shaky hands for the past 6 months, wife would like to get him checked out for Parkinson's. Wife, Timothy Hayden, on the line.       Reason for Disposition  Muscle jerks, tics, or shudders are a chronic symptom (recurrent or ongoing AND present > 4 weeks)  Answer Assessment - Initial Assessment Questions 1. APPEARANCE of MOVEMENT: What did the jerking or twitching look like? (Hayden.g., body area)     Shakiness of his hands  2. ONSET: When did this start happening? (Hayden.g., hours, days, weeks, months ago)     About 6 months  4. FREQUENCY:  How often does this happen?      Intermittently  5. WHEN: When does this happen? (Hayden.g., while awake, while falling asleep, while sleeping)     While awake  6. CAUSE: What do you think caused the shakiness?     Unsure  7. OTHER SYMPTOMS: Are there any other symptoms? (Hayden.g., fever, headache)     No  Protocols used: Muscle Jerks - Tics - [facility]

## 2024-02-06 ENCOUNTER — Ambulatory Visit (INDEPENDENT_AMBULATORY_CARE_PROVIDER_SITE_OTHER): Payer: PRIVATE HEALTH INSURANCE | Admitting: Emergency Medicine

## 2024-02-06 ENCOUNTER — Encounter: Payer: Self-pay | Admitting: Emergency Medicine

## 2024-02-06 ENCOUNTER — Ambulatory Visit: Payer: Self-pay | Admitting: Emergency Medicine

## 2024-02-06 VITALS — BP 156/82 | HR 62 | Temp 98.6°F | Resp 18 | Ht 69.0 in | Wt 191.0 lb

## 2024-02-06 DIAGNOSIS — R030 Elevated blood-pressure reading, without diagnosis of hypertension: Secondary | ICD-10-CM

## 2024-02-06 DIAGNOSIS — R251 Tremor, unspecified: Secondary | ICD-10-CM | POA: Diagnosis not present

## 2024-02-06 LAB — TSH: TSH: 1.13 u[IU]/mL (ref 0.35–5.50)

## 2024-02-06 NOTE — Progress Notes (Signed)
 Assessment & Plan:   Assessment & Plan Tremor Mild, noted on exam, improved with intentional movements. Reassurance provided. He had labs done in late July that were unremarkable but no TSH done since last October which was before the tremors were noticed.  We will go ahead and get this drawn today and I have encouraged close follow-up with previous neurology provider to discuss there are new concerns and whether they feel additional testing might be indicated (ex: DaTscan ) Orders:   TSH  Elevated BP without diagnosis of hypertension Likely situational.  Monitor, PCP follow-up for persistent elevation     Patient Instructions  [facility] [address] [place], [address] [place], [place]  [address] Phone:  [phone]    Recheck BP in a few days, and plan for primary care follow-up if it is persistently elevated   Corean Geralds, MSPAS, PA-C    Subjective:  Hand Tremors (Hand tremors x 6 mo. Tremors come and go. )    HPI: Timothy Hayden is a 68 y.o. male presenting on 02/06/2024 with report of tremor, noticed at least 56mo ago by family and friends.  He is accompanied by his wife, who assists in providing history.  The tremor has been going on for at least 6 months but perhaps longer.  It happens at random times and is not present all day long.  They cannot say if there is any association with the time with day, sleep status, or food intake.  Unable to clearly say if it is worse with rest or intention.  He sleep habits are good and there has been no change in his activity level aside from an increase in walking which he is able to do without difficulty. He denies headache, vision change, balance loss, slurred speech.  Wife mentions he will occasionally ask a question that does not quite make sense, but that has been going on for quite some time.  He did have an evaluation in October 2024 for some memory concerns.  Neurology ordered an MRI which  showed: IMPRESSION: 1. No evidence of an acute intracranial abnormality. 2. Minimal chronic small vessel ischemic changes within the cerebral white matter. 3. 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 4. Paranasal sinus disease as described. He was prescribed donepezil  but stopped it after several weeks due to extreme sleep disturbances with sleepwalking and a vague uncomfortable sensation with his heart.  He did have a follow-up with neuropsychiatry, who felt he had typical age-related decline.  A final follow-up with neurology from April of this year was canceled and not rescheduled. His father does have Parkinson's disease, so this is a particular concern for his family    ROS: Negative unless specifically indicated above in HPI.   Relevant past medical history reviewed and updated as indicated.   Allergies and medications reviewed and updated.   Current Outpatient Medications:    atorvastatin  (LIPITOR) 80 MG tablet, TAKE 1 TABLET BY MOUTH EVERYDAY AT BEDTIME, Disp: 90 tablet, Rfl: 2   clopidogrel  (PLAVIX ) 75 MG tablet, Take 1 tablet (75 mg total) by mouth daily., Disp: 90 tablet, Rfl: 3   finasteride (PROSCAR) 5 MG tablet, Take 5 mg by mouth daily., Disp: , Rfl:    ibuprofen  (ADVIL ) 600 MG tablet, Take 1 tablet (600 mg total) by mouth every 6 (six) hours as needed., Disp: 120 tablet, Rfl: 1   metoprolol  tartrate (LOPRESSOR ) 25 MG tablet, TAKE 1 TABLET BY MOUTH TWICE A DAY, Disp: 180 tablet, Rfl: 2  Multiple Vitamin (MULTI-VITAMINS) TABS, Take 1 tablet by mouth daily. , Disp: , Rfl:    nitroGLYCERIN  (NITROSTAT ) 0.4 MG SL tablet, Place 1 tablet (0.4 mg total) under the tongue every 5 (five) minutes as needed for chest pain., Disp: 75 tablet, Rfl: 2   Omega-3 Fatty Acids (FISH OIL) 1000 MG CAPS, Take 1,000 mg by mouth daily. , Disp: , Rfl:    sildenafil  (REVATIO ) 20 MG tablet, TAKE 3-5 TABLETS BY MOUTH AS NEEDED 1 HOUR PRIOR TO SEXUAL ACTIVITY, Disp: 30 tablet, Rfl: 3   tamsulosin  (FLOMAX) 0.4 MG CAPS capsule, Take 0.4 mg by mouth daily., Disp: , Rfl:   No Known Allergies  Social History   Tobacco Use   Smoking status: Never   Smokeless tobacco: Never  Vaping Use   Vaping status: Never Used  Substance Use Topics   Alcohol use: No   Drug use: No     Objective:   Vitals:   02/06/24 0849 02/06/24 0924  BP: (!) 154/88 (!) 156/82  Pulse: 62   Temp: 98.6 F (37 C)   Resp: 18   Height: 5' 9 (1.753 m)   Weight: 191 lb (86.6 kg)   SpO2: 95%   BMI (Calculated): 28.19      Appears well, pleasant, in no acute distress Sclera anicteric Oral mucosa moist Normal resp effort and excursion.  Alert and oriented x 3.  Cranial nerves III through XII are intact.  Finger-nose and rapid movements are intact.  There is a resting tremor noted worse on the left than the right in this right-hand-dominant patient.  Tremor improves with intentional movements.  He has a normal gait without truncal or limb ataxia.  He is able to perform heel and toe walk without difficulty.

## 2024-02-06 NOTE — Patient Instructions (Addendum)
 [facility] [address] [place], [address] [place], [place]  [address] Phone:  [phone]    Recheck BP in a few days, and plan for primary care follow-up if it is persistently elevated

## 2024-03-08 ENCOUNTER — Telehealth: Payer: Self-pay

## 2024-03-08 ENCOUNTER — Ambulatory Visit

## 2024-03-08 NOTE — Telephone Encounter (Signed)
 Pt here for NV scheduled by E2C2. Pt does not have an order from PCP for BP check. LOV 09/2023 with PCP. Acute visit with [facility] 01/2024.  Pt reports BP 147/83 taken today 150/xx also taken today  Tried to get pt schedule for appt with PCP, pt declined. States he will make an appt with Cardiology.

## 2024-03-19 ENCOUNTER — Telehealth: Payer: Self-pay | Admitting: Physician Assistant

## 2024-03-19 NOTE — Progress Notes (Signed)
 "   Mild Cognitive Impairment    Timothy Hayden is a very pleasant 68 y.o. 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 Patient is on ***. Patient was last seen  on 06/2023 and he had a neuropsychological evaluation without concerns for neurodegenerative disease.  He was on donepezil  5 mg daily till running out of it on 05/2023 ***. MMSE today is  /30.  Patient is able to participate on ADLs and to to drive without difficulties. Mood is anxious***. This patient is accompanied in the office by ***  who supplements the history. Previous records as well as any outside records available were reviewed prior to todays visit   Repeat neuropsych evaluation for diagnostic clarity not earlier than 06/2024  Continue to control mood as per PCP Recommend good control of cardiovascular risk factors Folllow up in  months***     Discussed the use of AI scribe software for clinical note transcription with the patient, who gave verbal consent to proceed.  History of Present Illness    Initial visit  How long did patient have memory difficulties?  Less than 1 yea.Reports some difficulty remembering new information, conversations and names.  Long-term memory is good. His wife finishes the sentences for him. Likes to do crossword puzzles, solitaire, sudoku, and word finding. 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  repeats oneself?  Denies Disoriented when walking into a room?  Patient denies  Leaving objects in unusual places? Denies.   Wandering behavior?  Denies. Any personality changes?  Denies.   Any history of depression?:  Denies   Hallucinations or paranoia?  Denies   Seizures?  Denies    Any sleep changes?   Sleeps well. He dreams frequently. Denies REM behavior or sleepwalking   Sleep apnea?  Denies   Any hygiene  concerns?  Denies   Independent of bathing and dressing?  Endorsed  Does the patient needs help with medications? Patient is in charge.  Who is in charge of the finances? Patient is in charge.    Any changes in appetite?  Denies    Patient have trouble swallowing? Denies.   Does the patient cook? Not much. Any kitchen accidents such as leaving the stove on? Denies.   Any history of headaches?   Denies.   Chronic pain ? Knee arthritis takes cortisone shots by Ortho  Ambulates with difficulty?  Denies. Walk frequently.  Recent falls or head injuries? Denies.   Vision changes? Denies.   Unilateral weakness, numbness or tingling? Denies.   Any tremors?   Denies.   Any anosmia?  For several years, unless the smell strong.   Any incontinence of urine? Denies.   Any bowel dysfunction? Denies.      Patient lives with wife History of heavy alcohol intake? Denies.   History of heavy tobacco use? Denies.   Family history of dementia? Mother had dementia ? type Does patient drive? Yes, he denies any issues but wife is concerned because sometimes when he is driving he may be heading the wrong direction, although he corrects himself. He attributes that have too much on my mind. 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    Neuropsych evaluation 06/28/23 Dr. Gayland  Briefly, results were broadly within expectations, aside from a few isolated low scores that did not cluster to any single domain. He does not show evidence of a neurocognitive disorder at this time. Isolated weaknesses identified today are not overly concerning in the broader context of his assessment. Weakness may relate to normal aging or test anxiety. 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 Results further serve as a baseline for future comparison, should it ever become necessary to re-evaluate the patient.    Past Medical History:  Diagnosis Date   CAD (coronary artery disease)    a. PCIx1 in 1999 b. abnormal nuc, then cath 01/08/2015 DES to 75% mid LAD, 95% small distal LCx treated medically   Hyperlipidemia    Hypertension    Myocardial infarction (HCC) 03/22/1997     Past Surgical History:  Procedure Laterality Date   CARDIAC CATHETERIZATION N/A 01/08/2015   Procedure: Left Heart Cath and Coronary Angiography;  Surgeon: Candyce GORMAN Reek, MD;  Location[facility] INVASIVE CV LAB;  Service: Cardiovascular;  Laterality: N/A;   CARDIAC CATHETERIZATION N/A 01/08/2015   Procedure: Coronary Stent Intervention;  Surgeon: Candyce GORMAN Reek, MD;  Location[facility] INVASIVE CV LAB;  Service: Cardiovascular;  Laterality: N/A;  mid lad 2.25x32 synergy   CARDIAC CATHETERIZATION  1999   CARDIAC CATHETERIZATION N/A 05/26/2015   Procedure: Left Heart Cath and Coronary Angiography;  Surgeon: Candyce GORMAN Reek, MD;  Location: [facility] INVASIVE CV LAB;  Service: Cardiovascular;  Laterality: N/A;   CORONARY ANGIOPLASTY WITH STENT PLACEMENT  1999   1 stent   ENDOSCOPIC STENT PLACEMENT W/ MLB     KIDNEY SURGERY Right 1971   had a blood vessel removed off my kidney   UMBILICAL HERNIA REPAIR N/A 05/11/2022   Procedure: LAPAROSCOPIC UMBILICAL HERNIA WITH MESH;  Surgeon: Paola Dreama SAILOR, MD;  Location: [facility] OR;  Service: General;  Laterality: N/A;         Objective:     PHYSICAL EXAMINATION:    VITALS:  There were no vitals filed for this visit.  GEN:  The patient appears stated age and is in NAD. HEENT:  Normocephalic, atraumatic.   Neurological examination:  General: NAD, well-groomed, appears stated age. Orientation: The patient is alert. Oriented to person,  place and not to date.*** Cranial nerves: There is good facial symmetry.The speech is fluent and clear. No aphasia or dysarthria. Fund of knowledge is appropriate. Recent memory impaired and remote memory is normal.  Attention and concentration are normal.  Able to name objects and repeat phrases.  Hearing is intact to conversational tone ***.   Delayed recall *** Sensation: Sensation is intact to light touch throughout Motor: Strength is at least antigravity x4. DTR's 2/4 in UE/LE      12/23/2022   12:00 PM 12/22/2022   12:00 PM  Montreal Cognitive Assessment   Visuospatial/ Executive (0/5) 2 4  Naming (0/3) 3 3  Attention: Read list of digits (0/2) 2 2  Attention: Read list of letters (0/1) 1 1  Attention: Serial 7 subtraction starting at 100 (0/3) 3 3  Language: Repeat phrase (0/2) 1 1  Language : Fluency (0/1) 1 1  Abstraction (0/2) 1 1  Delayed Recall (0/5) 4 4  Orientation (0/6) 5 5  Total 23 25  Adjusted Score (based on education)  23 25        No data to display            Movement examination: Tone: There is normal tone in the UE/LE Abnormal movements:  no tremor.  No myoclonus.  No asterixis.   Coordination:  There is no decremation with RAM's. Normal finger to nose  Gait and Station: The patient has no difficulty arising out of a deep-seated chair without the use of the hands. The patient's stride length is good.  Gait is cautious and narrow.   Thank you for allowing us  the opportunity to participate in the care of this nice patient. Please do not hesitate to contact us  for any questions or concerns.   Total time spent on today's visit was *** minutes dedicated to this patient today, preparing to see patient, examining the patient, ordering tests and/or medications and counseling the patient, documenting clinical information in the EHR or other health record, independently interpreting results and communicating results to the patient/family, discussing treatment and  goals, answering patient's questions and coordinating care.  Cc:  Berneta Elsie Sayre, MD  Camie Sevin 03/19/2024 6:00 PM      "

## 2024-03-19 NOTE — Telephone Encounter (Signed)
 [facility] call ID: [id number]  [facility]: [phone]  Leonor called stating that the patient confusion and memory is getting worse and she is worried, she would also like some new Rx sent in for the pt.   FYI: Appt scheduled for tomorrow.

## 2024-03-20 ENCOUNTER — Ambulatory Visit: Payer: Self-pay | Admitting: Physician Assistant

## 2024-03-20 ENCOUNTER — Ambulatory Visit: Admitting: Physician Assistant

## 2024-03-20 ENCOUNTER — Encounter: Payer: Self-pay | Admitting: Physician Assistant

## 2024-03-20 VITALS — BP 148/92 | HR 98 | Resp 20 | Ht 69.0 in | Wt 196.0 lb

## 2024-03-20 DIAGNOSIS — R419 Unspecified symptoms and signs involving cognitive functions and awareness: Secondary | ICD-10-CM | POA: Insufficient documentation

## 2024-03-20 NOTE — Patient Instructions (Signed)
 It was a pleasure to see you today at our office.   Recommendations:   Follow up in  3 months     [email or web address]-[email or web address]   RECOMMENDATIONS FOR ALL PATIENTS WITH MEMORY PROBLEMS: 1. Continue to exercise (Recommend 30 minutes of walking everyday, or 3 hours every week) 2. Increase social interactions - continue going to [place] and enjoy social gatherings with friends and family 3. Eat healthy, avoid fried foods and eat more fruits and vegetables 4. Maintain adequate blood pressure, blood sugar, and blood cholesterol level. Reducing the risk of stroke and cardiovascular disease also helps promoting better memory. 5. Avoid stressful situations. Live a simple life and avoid aggravations. Organize your time and prepare for the next day in anticipation. 6. Sleep well, avoid any interruptions of sleep and avoid any distractions in the bedroom that may interfere with adequate sleep quality 7. Avoid sugar, avoid sweets as there is a strong link between excessive sugar intake, diabetes, and cognitive impairment We discussed the Mediterranean diet, which has been shown to help patients reduce the risk of progressive memory disorders and reduces cardiovascular risk. This includes eating fish, eat fruits and green leafy vegetables, nuts like almonds and hazelnuts, walnuts, and also use olive oil. Avoid fast foods and fried foods as much as possible. Avoid sweets and sugar as sugar use has been linked to worsening of memory function.  There is always a concern of gradual progression of memory problems. If this is the case, then we may need to adjust level of care according to patient needs. Support, both to the patient and caregiver, should then be put into place.         DRIVING: Regarding driving, in patients with progressive memory problems, driving will be impaired. We advise to have someone else do the driving if trouble finding directions or if  minor accidents are reported. Independent driving assessment is available to determine safety of driving.   If you are interested in the driving assessment, you can contact the following:  [employer] in [place] [phone]  [facility] [phone]  [facility] [phone]  [facility] [phone] or [phone]   FALL PRECAUTIONS: Be cautious when walking. Scan the area for obstacles that may increase the risk of trips and falls. When getting up in the mornings, sit up at the edge of the bed for a few minutes before getting out of bed. Consider elevating the bed at the head end to avoid drop of blood pressure when getting up. Walk always in a well-lit room (use night lights in the walls). Avoid area rugs or power cords from appliances in the middle of the walkways. Use a walker or a cane if necessary and consider physical therapy for balance exercise. Get your eyesight checked regularly.  FINANCIAL OVERSIGHT: Supervision, especially oversight when making financial decisions or transactions is also recommended.  HOME SAFETY: Consider the safety of the kitchen when operating appliances like stoves, microwave oven, and blender. Consider having supervision and share cooking responsibilities until no longer able to participate in those. Accidents with firearms and other hazards in the house should be identified and addressed as well.   ABILITY TO BE LEFT ALONE: If patient is unable to contact 911 operator, consider using LifeLine, or when the need is there, arrange for someone to stay with patients. Smoking is a fire hazard, consider supervision or cessation. Risk of wandering should be assessed by caregiver and if detected at any point, supervision and safe proof recommendations  should be instituted.  MEDICATION SUPERVISION: Inability to self-administer medication needs to be constantly addressed. Implement a mechanism to ensure safe  administration of the medications.      Mediterranean Diet A Mediterranean diet refers to food and lifestyle choices that are based on the traditions of countries located on the [employer] [place]. This way of eating has been shown to help prevent certain conditions and improve outcomes for people who have chronic diseases, like kidney disease and heart disease. What are tips for following this plan? Lifestyle  Cook and eat meals together with your family, when possible. Drink enough fluid to keep your urine clear or pale yellow. Be physically active every day. This includes: Aerobic exercise like running or swimming. Leisure activities like gardening, walking, or housework. Get 7-8 hours of sleep each night. If recommended by your health care provider, drink red wine in moderation. This means 1 glass a day for nonpregnant women and 2 glasses a day for men. A glass of wine equals 5 oz (150 mL). Reading food labels  Check the serving size of packaged foods. For foods such as rice and pasta, the serving size refers to the amount of cooked product, not dry. Check the total fat in packaged foods. Avoid foods that have saturated fat or trans fats. Check the ingredients list for added sugars, such as corn syrup. Shopping  At the grocery store, buy most of your food from the areas near the walls of the store. This includes: Fresh fruits and vegetables (produce). Grains, beans, nuts, and seeds. Some of these may be available in unpackaged forms or large amounts (in bulk). Fresh seafood. Poultry and eggs. Low-fat dairy products. Buy whole ingredients instead of prepackaged foods. Buy fresh fruits and vegetables in-season from local farmers markets. Buy frozen fruits and vegetables in resealable bags. If you do not have access to quality fresh seafood, buy precooked frozen shrimp or canned fish, such as tuna, salmon, or sardines. Buy small amounts of raw or cooked vegetables, salads, or olives  from the deli or salad bar at your store. Stock your pantry so you always have certain foods on hand, such as olive oil, canned tuna, canned tomatoes, rice, pasta, and beans. Cooking  Cook foods with extra-virgin olive oil instead of using butter or other vegetable oils. Have meat as a side dish, and have vegetables or grains as your main dish. This means having meat in small portions or adding small amounts of meat to foods like pasta or stew. Use beans or vegetables instead of meat in common dishes like chili or lasagna. Experiment with different cooking methods. Try roasting or broiling vegetables instead of steaming or sauteing them. Add frozen vegetables to soups, stews, pasta, or rice. Add nuts or seeds for added healthy fat at each meal. You can add these to yogurt, salads, or vegetable dishes. Marinate fish or vegetables using olive oil, lemon juice, garlic, and fresh herbs. Meal planning  Plan to eat 1 vegetarian meal one day each week. Try to work up to 2 vegetarian meals, if possible. Eat seafood 2 or more times a week. Have healthy snacks readily available, such as: Vegetable sticks with hummus. Greek yogurt. Fruit and nut trail mix. Eat balanced meals throughout the week. This includes: Fruit: 2-3 servings a day Vegetables: 4-5 servings a day Low-fat dairy: 2 servings a day Fish, poultry, or lean meat: 1 serving a day Beans and legumes: 2 or more servings a week Nuts and seeds: 1-2 servings a day  Whole grains: 6-8 servings a day Extra-virgin olive oil: 3-4 servings a day Limit red meat and sweets to only a few servings a month What are my food choices? Mediterranean diet Recommended Grains: Whole-grain pasta. Brown rice. Bulgar wheat. Polenta. Couscous. Whole-wheat bread. [place] Made[place]. Vegetables: Artichokes. Beets. Broccoli. Cabbage. Carrots. Eggplant. Green beans. Chard. Kale. Spinach. Onions. Leeks. Peas. Squash. Tomatoes. Peppers. Radishes. Fruits: Apples.  Apricots. Avocado. Berries. Bananas. Cherries. Dates. Figs. Grapes. Lemons. Melon. Oranges. Peaches. Plums. Pomegranate. Meats and other protein foods: Beans. Almonds. Sunflower seeds. Pine nuts. Peanuts. Cod. Salmon. Scallops. Shrimp. Tuna. Tilapia. Clams. Oysters. Eggs. Dairy: Low-fat milk. Cheese. Greek yogurt. Beverages: Water. Red wine. Herbal tea. Fats and oils: Extra virgin olive oil. Avocado oil. Grape seed oil. Sweets and desserts: Greek yogurt with honey. Baked apples. Poached pears. Trail mix. Seasoning and other foods: Basil. Cilantro. Coriander. Cumin. Mint. Parsley. Sage. Rosemary. Tarragon. Garlic. Oregano. Thyme. Pepper. Balsalmic vinegar. Tahini. Hummus. Tomato sauce. Olives. Mushrooms. Limit these Grains: Prepackaged pasta or rice dishes. Prepackaged cereal with added sugar. Vegetables: Deep fried potatoes (french fries). Fruits: Fruit canned in syrup. Meats and other protein foods: Beef. Pork. Lamb. Poultry with skin. Hot dogs. [place]. Dairy: Ice cream. Sour cream. Whole milk. Beverages: Juice. Sugar-sweetened soft drinks. Beer. Liquor and spirits. Fats and oils: Butter. Canola oil. Vegetable oil. Beef fat (tallow). Lard. Sweets and desserts: Cookies. Cakes. Pies. Candy. Seasoning and other foods: Mayonnaise. Premade sauces and marinades. The items listed may not be a complete list. Talk with your dietitian about what dietary choices are right for you. Summary The Mediterranean diet includes both food and lifestyle choices. Eat a variety of fresh fruits and vegetables, beans, nuts, seeds, and whole grains. Limit the amount of red meat and sweets that you eat. Talk with your health care provider about whether it is safe for you to drink red wine in moderation. This means 1 glass a day for nonpregnant women and 2 glasses a day for men. A glass of wine equals 5 oz (150 mL). This information is not intended to replace advice given to you by your health care provider. Make sure  you discuss any questions you have with your health care provider. Document Released: 10/30/2015 Document Revised: 12/02/2015 Document Reviewed: 10/30/2015 Elsevier Interactive Patient Education  2017 [employer].

## 2024-04-06 ENCOUNTER — Ambulatory Visit: Admitting: Family Medicine

## 2024-04-06 ENCOUNTER — Encounter: Payer: Self-pay | Admitting: Family Medicine

## 2024-04-06 VITALS — BP 126/72 | HR 57 | Ht 69.0 in | Wt 195.4 lb

## 2024-04-06 DIAGNOSIS — H00015 Hordeolum externum left lower eyelid: Secondary | ICD-10-CM

## 2024-04-06 MED ORDER — ERYTHROMYCIN 5 MG/GM OP OINT: 1.0000 | TOPICAL_OINTMENT | Freq: Three times a day (TID) | OPHTHALMIC | 1 refills | Status: AC

## 2024-04-06 NOTE — Progress Notes (Signed)
 "   Patient Care Team: Berneta Elsie Sayre, MD as PCP - General (Family Medicine) Dann Candyce RAMAN, MD as PCP - Cardiology (Cardiology)   Assessment & Plan:   1. Hordeolum externum of left lower eyelid (Primary) External hordeolum (stye) - recurrent, with localized irritation and erythema; possible embedded eyelash. No evidence of preseptal or orbital cellulitis. - Warm compresses to affected eyelid 10-15 minutes, 3-4 times daily to promote drainage. - Gentle lid hygiene with diluted baby shampoo or tear-free cleanser. - Erythromycin  ophthalmic ointment: apply thin ribbon to affected eye (inside lower lid) and along lash line as directed. - Prescription sent to pharmacy. - Return precautions: worsening redness, swelling, pain, vision changes, or lack of improvement.  Geni Shutter, DO, MS, FAAFP, Dipl. [facility] [address] [place], [address] Dept: [phone] Dept Fax: [phone]  Subjective:   Timothy Hayden is a 69 year old male who presents with a sty on his eye.  Ocular lesion (sty) - Sty present for 2 to 4 weeks, initially located in the corner of the eye - Initial episode resolved, with recurrence approximately one week ago - Current episode associated with drainage, described as crystal-like discharge - No use of ointment for current sty  Ocular medication use - Using antibiotic eye drops previously prescribed for corneal scratch - No known antibiotic allergies Review of Systems: Negative, with the exception of above mentioned in HPI.  History:   Reviewed by clinician on day of visit: allergies, medications, problem list, medical history, surgical history, family history, social history, and previous encounter notes.  Medications:   Show/hide medication list[1] Allergies[2]  Objective:   BP 126/72 (BP Location: Left Arm, Cuff Size: Normal)   Pulse (!) 57   Ht 5' 9 (1.753 m)   Wt 195 lb 6.4  oz (88.6 kg)   SpO2 98%   BMI 28.86 kg/m   Physical Exam Vitals reviewed.  Constitutional:      Appearance: Normal appearance.  Eyes:      Comments: Localized eyelid lesion along the lid margin c/w resolving hordeolum, s/p spontaneous drainage, now drying/crusting. No active purulent drainage. Minimal residual erythema/edema. Conjunctiva clear. EOMI without pain. Vision grossly intact. No periorbital cellulitis.   Neurological:     Mental Status: He is alert.   Attestations:   Reviewed by clinician on day of visit: allergies, medications, problem list, medical history, surgical history, family history, social history, and previous encounter notes.  The patient is being seen today for an acute visit.     [1]  Outpatient Medications Prior to Visit  Medication Sig   atorvastatin  (LIPITOR) 80 MG tablet TAKE 1 TABLET BY MOUTH EVERYDAY AT BEDTIME   clopidogrel  (PLAVIX ) 75 MG tablet Take 1 tablet (75 mg total) by mouth daily.   finasteride (PROSCAR) 5 MG tablet Take 5 mg by mouth daily.   ibuprofen  (ADVIL ) 600 MG tablet Take 1 tablet (600 mg total) by mouth every 6 (six) hours as needed.   metoprolol  tartrate (LOPRESSOR ) 25 MG tablet TAKE 1 TABLET BY MOUTH TWICE A DAY   Multiple Vitamin (MULTI-VITAMINS) TABS Take 1 tablet by mouth daily.    nitroGLYCERIN  (NITROSTAT ) 0.4 MG SL tablet Place 1 tablet (0.4 mg total) under the tongue every 5 (five) minutes as needed for chest pain.   Omega-3 Fatty Acids (FISH OIL) 1000 MG CAPS Take 1,000 mg by mouth daily.    sildenafil  (REVATIO ) 20 MG tablet TAKE 3-5 TABLETS BY MOUTH AS NEEDED  1 HOUR PRIOR TO SEXUAL ACTIVITY   tamsulosin (FLOMAX) 0.4 MG CAPS capsule Take 0.4 mg by mouth daily.   No facility-administered medications prior to visit.  [2] No Known Allergies  "

## 2024-06-13 ENCOUNTER — Ambulatory Visit: Payer: Self-pay | Admitting: Physician Assistant

## 2024-09-17 ENCOUNTER — Ambulatory Visit: Payer: PRIVATE HEALTH INSURANCE

## 2024-10-19 ENCOUNTER — Encounter: Payer: PRIVATE HEALTH INSURANCE | Admitting: Family Medicine
